# Patient Record
Sex: Male | Born: 1955
Health system: Southern US, Community
[De-identification: ages and names within clinical notes are randomized; demographics above are authoritative.]

## PROBLEM LIST (undated history)

## (undated) DIAGNOSIS — C4490 Unspecified malignant neoplasm of skin, unspecified: Secondary | ICD-10-CM

## (undated) HISTORY — PX: APPENDECTOMY: SHX54

---

## 2015-09-07 ENCOUNTER — Emergency Department (HOSPITAL_COMMUNITY)
Admission: EM | Admit: 2015-09-07 | Discharge: 2015-09-08 | Disposition: A | Discharge status: Home / Self Care | Payer: Managed Care, Other (non HMO) | Attending: Emergency Medicine | Admitting: Emergency Medicine

## 2015-09-07 ENCOUNTER — Encounter (HOSPITAL_COMMUNITY): Payer: Self-pay | Admitting: Emergency Medicine

## 2015-09-07 DIAGNOSIS — Z85828 Personal history of other malignant neoplasm of skin: Secondary | ICD-10-CM | POA: Diagnosis not present

## 2015-09-07 DIAGNOSIS — R7989 Other specified abnormal findings of blood chemistry: Secondary | ICD-10-CM | POA: Diagnosis present

## 2015-09-07 DIAGNOSIS — R899 Unspecified abnormal finding in specimens from other organs, systems and tissues: Secondary | ICD-10-CM

## 2015-09-07 HISTORY — DX: Unspecified malignant neoplasm of skin, unspecified: C44.90

## 2015-09-07 LAB — CBC
HCT: 46.7 % (ref 39.0–52.0)
HEMOGLOBIN: 15.9 g/dL (ref 13.0–17.0)
MCH: 29.9 pg (ref 26.0–34.0)
MCHC: 34 g/dL (ref 30.0–36.0)
MCV: 87.8 fL (ref 78.0–100.0)
Platelets: UNDETERMINED 10*3/uL (ref 150–400)
RBC: 5.32 MIL/uL (ref 4.22–5.81)
RDW: 13.1 % (ref 11.5–15.5)
WBC: 6.6 10*3/uL (ref 4.0–10.5)

## 2015-09-07 LAB — CBC WITH DIFFERENTIAL/PLATELET
BASOS ABS: 0.1 10*3/uL (ref 0.0–0.1)
Basophils Relative: 1 %
EOS PCT: 3 %
Eosinophils Absolute: 0.2 10*3/uL (ref 0.0–0.7)
HEMATOCRIT: 43.6 % (ref 39.0–52.0)
HEMOGLOBIN: 15 g/dL (ref 13.0–17.0)
LYMPHS PCT: 43 %
Lymphs Abs: 2.9 10*3/uL (ref 0.7–4.0)
MCH: 30.2 pg (ref 26.0–34.0)
MCHC: 34.4 g/dL (ref 30.0–36.0)
MCV: 87.9 fL (ref 78.0–100.0)
MONOS PCT: 8 %
Monocytes Absolute: 0.5 10*3/uL (ref 0.1–1.0)
NEUTROS PCT: 45 %
Neutro Abs: 3.1 10*3/uL (ref 1.7–7.7)
Platelets: UNDETERMINED 10*3/uL (ref 150–400)
RBC: 4.96 MIL/uL (ref 4.22–5.81)
RDW: 13.3 % (ref 11.5–15.5)
WBC: 6.8 10*3/uL (ref 4.0–10.5)

## 2015-09-07 LAB — BASIC METABOLIC PANEL
ANION GAP: 11 (ref 5–15)
BUN: 15 mg/dL (ref 6–20)
CHLORIDE: 104 mmol/L (ref 101–111)
CO2: 26 mmol/L (ref 22–32)
Calcium: 9.9 mg/dL (ref 8.9–10.3)
Creatinine, Ser: 1.13 mg/dL (ref 0.61–1.24)
Glucose, Bld: 94 mg/dL (ref 65–99)
POTASSIUM: 4.4 mmol/L (ref 3.5–5.1)
SODIUM: 141 mmol/L (ref 135–145)

## 2015-09-07 NOTE — ED Notes (Signed)
Called lab, no answer. Attempted to update family on results.

## 2015-09-07 NOTE — ED Notes (Signed)
Pt reports he went to pcp today for follow up blood work and was told to come to ER d/t low platelet count. Pt sts he feels normal. No complaints at this time.

## 2015-09-07 NOTE — Discharge Instructions (Signed)
Mr. Laplante and Family,  Nice meeting you! Please follow up with your primary care provider. I have attached information for the [facility] group. Return to the emergency department if you develop fevers, chest pain, shortness of breath, easy bruising/bleeding, or fatigue. Happy Thanksgiving!  S. Wendie Simmer, PA-C

## 2015-09-07 NOTE — ED Notes (Signed)
Called main lab in regards to platelet results. They report minilab planned to redraw.

## 2015-09-07 NOTE — ED Notes (Signed)
Called main lab in regards to platelet results, currently being reviewed within lab with tech that processes. Awaiting follow up.

## 2015-09-07 NOTE — ED Notes (Signed)
Called main lab, spoke with Ulyess Mort to verify that cbc sample has been received from tube station once order has been reentered.

## 2015-09-07 NOTE — ED Provider Notes (Signed)
CSN: [id number]     Arrival date & time 09/07/15  1712 History   First MD Initiated Contact with Patient 09/07/15 2007     Chief Complaint  Patient presents with  . Abnormal Lab   HPI   Jackson Brown is a 59 y.o. M PMH significant for skin cancer presenting from his PCP office for low platelet count on routine bloodwork. He is asymptomatic. He denies petechiae, epistaxis, easy bruising, hematuria, GIB, recent illnesses.    Past Medical History  Diagnosis Date  . Skin cancer    History reviewed. No pertinent past surgical history. No family history on file. Social History  Substance Use Topics  . Smoking status: Never Smoker   . Smokeless tobacco: None  . Alcohol Use: Yes     Comment: occasional    Review of Systems  Ten systems are reviewed and are negative for acute change except as noted in the HPI  Allergies  Review of patient's allergies indicates no known allergies.  Home Medications   Prior to Admission medications   Not on File   BP 139/79 mmHg  Pulse 61  Temp(Src) 97.7 F (36.5 C) (Oral)  Resp 18  Ht 6\' 3"  (1.905 m)  Wt 115.713 kg  BMI 31.89 kg/m2  SpO2 97% Physical Exam  Constitutional: He appears well-developed and well-nourished. No distress.  HENT:  Head: Normocephalic and atraumatic.  Mouth/Throat: Oropharynx is clear and moist. No oropharyngeal exudate.  Eyes: Conjunctivae are normal. Pupils are equal, round, and reactive to light. Right eye exhibits no discharge. Left eye exhibits no discharge. No scleral icterus.  Neck: No tracheal deviation present.  Cardiovascular: Normal rate, regular rhythm, normal heart sounds and intact distal pulses.  Exam reveals no gallop and no friction rub.   No murmur heard. Pulmonary/Chest: Effort normal and breath sounds normal. No respiratory distress. He has no wheezes. He has no rales. He exhibits no tenderness.  Abdominal: Soft. Bowel sounds are normal. He exhibits no distension and no mass. There is no  tenderness. There is no rebound and no guarding.  Musculoskeletal: He exhibits no edema.  Lymphadenopathy:    He has no cervical adenopathy.  Neurological: He is alert. Coordination normal.  Skin: Skin is warm and dry. No rash noted. He is not diaphoretic. No erythema.  Psychiatric: He has a normal mood and affect. His behavior is normal.  Nursing note and vitals reviewed.   ED Course  Procedures (including critical care time) Labs Review Labs Reviewed  CBC  BASIC METABOLIC PANEL  CBC WITH DIFFERENTIAL/PLATELET    MDM   Final diagnoses:  Abnormal laboratory test   Patient non-toxic appearing and VSS.  Repeat CBC shows massive platelet clumps noted on smear. Called lab to see if they could estimate a count and they advised another platelet order.   After a delay and repeat phone calls, the order was changed to another CBC with diff, which demonstrated the same results as the previous test. Called lab without answer twice.  Discussed cased with Dr. Alfonse Spruce who advised OP follow-up with heme/onc. Patient may be safely discharged home. Discussed reasons for return. Patient to follow-up with heme/onc. Patient in understanding and agreement with the plan.   Corning Lions, PA-C 09/22/15 2058  Harvel Quale, MD 09/23/15 [phone]

## 2015-09-08 NOTE — ED Notes (Signed)
Apologized to patient for delay, reviewed discharge instructions and labs provided by MD in discharge teaching. Family acknowledges.

## 2015-09-08 NOTE — ED Notes (Signed)
Walked down to main lab to discuss results and possible alternatives for testing. Advised to recollect in a lavendar and blue top due to "clotting" on both previous blood draws. Informed Samantha, PA-C, and Dr. Sherrie George. No repeat for blood draw, patient is to discharge and follow up with oncology.

## 2015-10-12 LAB — CBC AND DIFFERENTIAL
HCT: 45 (ref 41–53)
Hemoglobin: 15.4 (ref 13.5–17.5)
Platelets: 115 — AB (ref 150–399)
WBC: 5.5

## 2017-09-26 ENCOUNTER — Ambulatory Visit (HOSPITAL_BASED_OUTPATIENT_CLINIC_OR_DEPARTMENT_OTHER)
Admission: RE | Admit: 2017-09-26 | Discharge: 2017-09-26 | Disposition: A | Discharge status: Home / Self Care | Payer: 59 | Source: Ambulatory Visit | Attending: Family Medicine | Admitting: Family Medicine

## 2017-09-26 ENCOUNTER — Encounter: Payer: Self-pay | Admitting: Family Medicine

## 2017-09-26 ENCOUNTER — Ambulatory Visit: Payer: 59 | Admitting: Family Medicine

## 2017-09-26 VITALS — BP 118/76 | HR 73 | Temp 97.7°F | Ht 75.0 in | Wt 241.1 lb

## 2017-09-26 DIAGNOSIS — M5410 Radiculopathy, site unspecified: Secondary | ICD-10-CM

## 2017-09-26 DIAGNOSIS — Z0000 Encounter for general adult medical examination without abnormal findings: Secondary | ICD-10-CM | POA: Diagnosis not present

## 2017-09-26 DIAGNOSIS — Z1211 Encounter for screening for malignant neoplasm of colon: Secondary | ICD-10-CM | POA: Insufficient documentation

## 2017-09-26 DIAGNOSIS — E559 Vitamin D deficiency, unspecified: Secondary | ICD-10-CM

## 2017-09-26 DIAGNOSIS — H6123 Impacted cerumen, bilateral: Secondary | ICD-10-CM | POA: Diagnosis not present

## 2017-09-26 MED ORDER — MELOXICAM 15 MG PO TABS: ORAL_TABLET | ORAL | 1 refills | Status: DC

## 2017-09-26 NOTE — Progress Notes (Addendum)
Subjective:  Patient ID: Jackson Brown, male    DOB: 1956/03/04  Age: 61 y.o. MRN: [id number]  CC: Establish Care   HPI Tae Vonada presents for establish care and physical exam.  He also is concerned about a pain that he he has been experiencing in his left leg.  This is been going on for 2 weeks.  The pain is located in his lateral thigh and moves down towards his knee and into his anterior leg on occasion.  It did seem to start with some back pain that has since cleared.  He is active physically.  He walks 3-5 miles daily.  He works out in [employer] including doing squats with [employer].  He has not experienced an injury.  He has had no weakness numbness or tingling.  There have been no saddle paresthesias. No bowel or bladder incontinence.  He is hearing impaired and uses amplification devices.  There is been mild decrease in force of his stream and nocturia x1.  The symptoms respond to nighttime fluid restriction.  He had a normal colonoscopy in 2014.  He lives with his wife.  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  His oldest daughter just passed her bar exam and hopes to work in [place].  His son is studying international business at the [employer].  He rarely drinks beer and does not smoke or use illicit drugs.  He has been diagnosed with thrombocytopenia in the past.  He saw the hematologist and it was discovered that this was due to clumping.  Blue top tubes have been recommended for his CBCs. History Madoc has a past medical history of Skin cancer.   He has no past surgical history on file.   His family history includes Cancer in his mother; Diabetes in his father and paternal grandfather; Early death in his mother; Hearing loss in his father; Hypertension in his brother and father; Stroke in his father.He reports that  has never smoked. he has never used smokeless tobacco. He reports that he drinks alcohol. He reports that he does not use drugs.  Outpatient  Medications Prior to Visit  Medication Sig Dispense Refill  . Multiple Vitamin (MULTIVITAMIN WITH MINERALS) TABS tablet Take 1 tablet by mouth daily.    . calcium gluconate 500 MG tablet Take 1 tablet by mouth daily.     No facility-administered medications prior to visit.     ROS Review of Systems  HENT: Negative.   Eyes: Negative.   Respiratory: Negative.   Cardiovascular: Negative.   Gastrointestinal: Negative.   Endocrine: Negative for polyphagia and polyuria.  Genitourinary: Negative for difficulty urinating, frequency, hematuria and urgency.  Musculoskeletal: Positive for myalgias. Negative for back pain.  Allergic/Immunologic: Negative for immunocompromised state.  Neurological: Negative for weakness and numbness.  Hematological: Negative for adenopathy. Does not bruise/bleed easily.  Psychiatric/Behavioral: Negative for agitation and dysphoric mood.    Objective:  BP 118/76 (BP Location: Left Arm, Patient Position: Sitting, Cuff Size: Normal)   Pulse 73   Temp 97.7 F (36.5 C) (Oral)   Ht 6\' 3"  (1.905 m)   Wt 241 lb 2 oz (109.4 kg)   SpO2 97%   BMI 30.14 kg/m   Physical Exam  Constitutional: He is oriented to person, place, and time. He appears well-developed and well-nourished. No distress.  HENT:  Head: Normocephalic and atraumatic.  Right Ear: External ear normal.  Left Ear: External ear normal.  Ears:  Mouth/Throat: Oropharynx is clear  and moist. No oropharyngeal exudate.  Eyes: Conjunctivae are normal. Pupils are equal, round, and reactive to light. Right eye exhibits no discharge. Left eye exhibits no discharge. No scleral icterus.  Neck: Neck supple. No JVD present. No tracheal deviation present. No thyromegaly present.  Cardiovascular: Normal rate, regular rhythm and normal heart sounds.  Occasional extrasystoles are present.  Pulmonary/Chest: Effort normal and breath sounds normal. No stridor.  Abdominal: Soft. Bowel sounds are normal. He exhibits no  distension. There is no tenderness. There is no rebound.  Genitourinary: Rectum normal and prostate normal. Rectal exam shows no external hemorrhoid, no internal hemorrhoid, no fissure, no mass, no tenderness, anal tone normal and guaiac negative stool. Prostate is not enlarged and not tender.  Musculoskeletal:       Right hip: He exhibits decreased range of motion. He exhibits normal strength, no tenderness and no bony tenderness.       Left hip: Normal.       Lumbar back: He exhibits normal range of motion, no tenderness, no bony tenderness, no deformity and no spasm.  Lymphadenopathy:    He has no cervical adenopathy.  Neurological: He is alert and oriented to person, place, and time. He has normal strength.  Reflex Scores:      Patellar reflexes are 1+ on the right side and 1+ on the left side.      Achilles reflexes are 1+ on the right side and 1+ on the left side. Negative dural tension signs.    Skin: Skin is warm and dry. He is not diaphoretic.  Psychiatric: He has a normal mood and affect. His behavior is normal.      Assessment & Plan:   Deontez was seen today for establish care.  Diagnoses and all orders for this visit:  Physical exam  Healthcare maintenance -     CBC; Future -     Comprehensive metabolic panel; Future -     Hepatitis C antibody; Future -     HIV antibody; Future -     Lipid panel; Future -     PSA; Future -     TSH; Future -     Urinalysis, Routine w reflex microscopic; Future -     VITAMIN D 25 Hydroxy (Vit-D Deficiency, Fractures); Future  Radiculopathy of leg -     DG Lumbar Spine Complete; Future -     DG HIPS BILAT WITH PELVIS 2V; Future -     meloxicam (MOBIC) 15 MG tablet; One daily with food for 2 weeks and then as needed.  Ceruminosis, bilateral  Vitamin D deficiency -     Vitamin D, Ergocalciferol, (DRISDOL) 50000 units CAPS capsule; Take 1 capsule (50,000 Units total) by mouth every 7 (seven) days.   I have discontinued Dahmir  Rego's calcium gluconate. I am also having him start on meloxicam and Vitamin D (Ergocalciferol). Additionally, I am having him maintain his multivitamin with minerals.  Meds ordered this encounter  Medications  . meloxicam (MOBIC) 15 MG tablet    Sig: One daily with food for 2 weeks and then as needed.    Dispense:  30 tablet    Refill:  1  . Vitamin D, Ergocalciferol, (DRISDOL) 50000 units CAPS capsule    Sig: Take 1 capsule (50,000 Units total) by mouth every 7 (seven) days.    Dispense:  30 capsule    Refill:  0     Follow-up: Return in about 2 weeks (around 10/10/2017), or if symptoms worsen  or fail to improve.  Libby Maw, MD

## 2017-09-27 ENCOUNTER — Other Ambulatory Visit (INDEPENDENT_AMBULATORY_CARE_PROVIDER_SITE_OTHER): Payer: 59

## 2017-09-27 ENCOUNTER — Encounter: Payer: Self-pay | Admitting: Family Medicine

## 2017-09-27 DIAGNOSIS — Z0000 Encounter for general adult medical examination without abnormal findings: Secondary | ICD-10-CM

## 2017-09-27 DIAGNOSIS — E559 Vitamin D deficiency, unspecified: Secondary | ICD-10-CM | POA: Insufficient documentation

## 2017-09-27 LAB — URINALYSIS, ROUTINE W REFLEX MICROSCOPIC
Bilirubin Urine: NEGATIVE
HGB URINE DIPSTICK: NEGATIVE
Ketones, ur: NEGATIVE
Leukocytes, UA: NEGATIVE
NITRITE: NEGATIVE
RBC / HPF: NONE SEEN (ref 0–?)
SPECIFIC GRAVITY, URINE: 1.02 (ref 1.000–1.030)
TOTAL PROTEIN, URINE-UPE24: NEGATIVE
URINE GLUCOSE: NEGATIVE
UROBILINOGEN UA: 0.2 (ref 0.0–1.0)
pH: 7 (ref 5.0–8.0)

## 2017-09-27 LAB — COMPREHENSIVE METABOLIC PANEL
ALT: 29 U/L (ref 0–53)
AST: 21 U/L (ref 0–37)
Albumin: 4.5 g/dL (ref 3.5–5.2)
Alkaline Phosphatase: 47 U/L (ref 39–117)
BUN: 24 mg/dL — ABNORMAL HIGH (ref 6–23)
CO2: 30 meq/L (ref 19–32)
Calcium: 9.2 mg/dL (ref 8.4–10.5)
Chloride: 103 mEq/L (ref 96–112)
Creatinine, Ser: 1.07 mg/dL (ref 0.40–1.50)
GFR: 74.23 mL/min (ref >60.00)
GLUCOSE: 102 mg/dL — AB (ref 70–99)
POTASSIUM: 4.9 meq/L (ref 3.5–5.1)
Sodium: 140 mEq/L (ref 135–145)
Total Bilirubin: 0.4 mg/dL (ref 0.2–1.2)
Total Protein: 7 g/dL (ref 6.0–8.3)

## 2017-09-27 LAB — CBC
HCT: 48.6 % (ref 39.0–52.0)
HEMOGLOBIN: 16.1 g/dL (ref 13.0–17.0)
MCHC: 33.1 g/dL (ref 30.0–36.0)
MCV: 91.8 fl (ref 78.0–100.0)
PLATELETS: 179 10*3/uL (ref 150.0–400.0)
RBC: 5.3 Mil/uL (ref 4.22–5.81)
RDW: 13.2 % (ref 11.5–15.5)
WBC: 6.1 10*3/uL (ref 4.0–10.5)

## 2017-09-27 LAB — LIPID PANEL
CHOL/HDL RATIO: 4
Cholesterol: 156 mg/dL (ref 0–200)
HDL: 34.9 mg/dL — AB (ref >39.00)
NONHDL: 121.31
TRIGLYCERIDES: 247 mg/dL — AB (ref 0.0–149.0)
VLDL: 49.4 mg/dL — ABNORMAL HIGH (ref 0.0–40.0)

## 2017-09-27 LAB — PSA: PSA: 2.39 ng/mL (ref 0.10–4.00)

## 2017-09-27 LAB — VITAMIN D 25 HYDROXY (VIT D DEFICIENCY, FRACTURES): VITD: 23.46 ng/mL — AB (ref 30.00–100.00)

## 2017-09-27 LAB — TSH: TSH: 2.74 u[IU]/mL (ref 0.35–4.50)

## 2017-09-27 LAB — LDL CHOLESTEROL, DIRECT: Direct LDL: 98 mg/dL

## 2017-09-27 MED ORDER — VITAMIN D (ERGOCALCIFEROL) 1.25 MG (50000 UNIT) PO CAPS: 50000.0000 [IU] | ORAL_CAPSULE | ORAL | 0 refills | Status: DC

## 2017-09-27 NOTE — Addendum Note (Signed)
Addended by: Jon Billings on: 09/27/2017 03:47 PM   Modules accepted: Orders

## 2017-09-28 LAB — HEPATITIS C ANTIBODY
HEP C AB: NONREACTIVE
SIGNAL TO CUT-OFF: 0.02 (ref <1.00)

## 2017-09-28 LAB — HIV ANTIBODY (ROUTINE TESTING W REFLEX): HIV: NONREACTIVE

## 2017-10-02 ENCOUNTER — Ambulatory Visit (INDEPENDENT_AMBULATORY_CARE_PROVIDER_SITE_OTHER): Payer: 59 | Admitting: Family Medicine

## 2017-10-02 ENCOUNTER — Encounter: Payer: Self-pay | Admitting: Family Medicine

## 2017-10-02 VITALS — BP 126/80 | HR 60 | Temp 97.3°F | Ht 75.0 in | Wt 240.0 lb

## 2017-10-02 DIAGNOSIS — M5416 Radiculopathy, lumbar region: Secondary | ICD-10-CM

## 2017-10-02 DIAGNOSIS — E559 Vitamin D deficiency, unspecified: Secondary | ICD-10-CM

## 2017-10-02 MED ORDER — VITAMIN D (ERGOCALCIFEROL) 1.25 MG (50000 UNIT) PO CAPS: 50000.0000 [IU] | ORAL_CAPSULE | ORAL | 1 refills | Status: DC

## 2017-10-02 MED ORDER — NAPROXEN 500 MG PO TABS: 500.0000 mg | ORAL_TABLET | Freq: Two times a day (BID) | ORAL | 0 refills | Status: DC

## 2017-10-02 MED ORDER — CYCLOBENZAPRINE HCL 10 MG PO TABS: 10.0000 mg | ORAL_TABLET | Freq: Every day | ORAL | 0 refills | Status: DC

## 2017-10-02 NOTE — Progress Notes (Signed)
_  Subjective:  Patient ID: Jackson Brown, male    DOB: May 14, 1956  Age: 61 y.o. MRN: [id number]  CC: Follow-up (medication is not working)   HPI Jackson Brown presents for follow-up of his left leg pain.  He continues to have pain radiating from his left anterior thigh down to the anterior leg.  He denies back pain.  He denies saddle paresthesias, weakness or bowel or bladder incontinence.  He says that the meloxicam really has not helped.  He says that Naprosyn and Flexeril have worked for this problem in the past.  When I asked him about muscle spasms he says that actually he felt acute isolated spasms in his posterior thigh just prior to the onset of the pain.  He does not snore or have symptoms consistent with apnea.  However according to his wife he is an active sleeper with some of his legs.  She actually sleeps on the other side of the bed away from him.  Outpatient Medications Prior to Visit  Medication Sig Dispense Refill  . Multiple Vitamin (MULTIVITAMIN WITH MINERALS) TABS tablet Take 1 tablet by mouth daily.    . meloxicam (MOBIC) 15 MG tablet One daily with food for 2 weeks and then as needed. 30 tablet 1  . Vitamin D, Ergocalciferol, (DRISDOL) 50000 units CAPS capsule Take 1 capsule (50,000 Units total) by mouth every 7 (seven) days. 30 capsule 0   No facility-administered medications prior to visit.     ROS Review of Systems  Constitutional: Negative.   Cardiovascular: Negative.   Gastrointestinal: Negative.   Genitourinary: Negative for difficulty urinating and urgency.  Musculoskeletal: Positive for myalgias. Negative for back pain, gait problem, neck pain and neck stiffness.  Skin: Negative for pallor and rash.  Neurological: Negative for weakness and numbness.  Hematological: Does not bruise/bleed easily.  Psychiatric/Behavioral: Negative.     Objective:  BP 126/80 (BP Location: Left Arm, Patient Position: Sitting, Cuff Size: Normal)   Pulse 60   Temp (!)  97.3 F (36.3 C) (Oral)   Ht 6\' 3"  (1.905 m)   Wt 240 lb (108.9 kg)   SpO2 98%   BMI 30.00 kg/m   BP Readings from Last 3 Encounters:  10/02/17 126/80  09/26/17 118/76  09/08/15 117/59    Wt Readings from Last 3 Encounters:  10/02/17 240 lb (108.9 kg)  09/26/17 241 lb 2 oz (109.4 kg)  09/07/15 255 lb 1.6 oz (115.7 kg)    Physical Exam  Constitutional: He is oriented to person, place, and time. He appears well-developed and well-nourished. No distress.  HENT:  Head: Normocephalic and atraumatic.  Right Ear: External ear normal.  Left Ear: External ear normal.  Eyes: Right eye exhibits no discharge. Left eye exhibits no discharge.  Cardiovascular:  Pulses:      Dorsalis pedis pulses are 2+ on the right side, and 2+ on the left side.       Posterior tibial pulses are 2+ on the right side, and 2+ on the left side.  Pulmonary/Chest: Effort normal.  Musculoskeletal:       Legs: Neurological: He is alert and oriented to person, place, and time.  Skin: He is not diaphoretic.    Lab Results  Component Value Date   WBC 6.1 09/27/2017   HGB 16.1 09/27/2017   HCT 48.6 09/27/2017   PLT 179.0 09/27/2017   GLUCOSE 102 (H) 09/27/2017   CHOL 156 09/27/2017   TRIG 247.0 (H) 09/27/2017   HDL 34.90 (L) 09/27/2017  LDLDIRECT 98.0 09/27/2017   ALT 29 09/27/2017   AST 21 09/27/2017   NA 140 09/27/2017   K 4.9 09/27/2017   CL 103 09/27/2017   CREATININE 1.07 09/27/2017   BUN 24 (H) 09/27/2017   CO2 30 09/27/2017   TSH 2.74 09/27/2017   PSA 2.39 09/27/2017    Dg Lumbar Spine Complete  Result Date: 09/26/2017 CLINICAL DATA:  Lower left-sided pain radiating into the left leg. Uncomfortable to lie supine. EXAM: LUMBAR SPINE - COMPLETE 4+ VIEW COMPARISON:  None in PACs FINDINGS: The lumbar vertebral bodies are preserved in height. The disc space heights are well maintained there are small endplate osteophytes at L2 through L4. There is no spondylolisthesis. There is no significant  facet joint hypertrophy. The pedicles and transverse processes are intact. IMPRESSION: There is no acute or significant chronic bony abnormality of the lumbar spine. Electronically Signed   By: David  Martinique M.D.   On: 09/26/2017 14:09   Dg Hips Bilat With Pelvis 2v  Result Date: 09/26/2017 CLINICAL DATA:  Lower left-sided pain radiating into the left leg. Uncomfortable July supine. EXAM: DG HIP (WITH OR WITHOUT PELVIS) 2V left COMPARISON:  None in PACs FINDINGS: The bony pelvis is subjectively adequately mineralized. There is no lytic nor blastic lesion. There is no acute or old fracture. AP and lateral views of the left hip reveal preservation of the joint space. The articular surfaces of the femoral head and acetabulum remains smoothly rounded. The femoral neck, intertrochanteric, and subtrochanteric regions are normal. IMPRESSION: There is no acute or significant chronic bony abnormality of the left hip. Electronically Signed   By: David  Martinique M.D.   On: 09/26/2017 14:11    Assessment & Plan:    I have discontinued [name]'s meloxicam. I am also having him start on cyclobenzaprine and naproxen. Additionally, I am having him maintain his multivitamin with minerals and Vitamin D (Ergocalciferol).  Meds ordered this encounter  Medications  . Vitamin D, Ergocalciferol, (DRISDOL) 50000 units CAPS capsule    Sig: Take 1 capsule (50,000 Units total) by mouth every 7 (seven) days.    Dispense:  12 capsule    Refill:  1  . cyclobenzaprine (FLEXERIL) 10 MG tablet    Sig: Take 1 tablet (10 mg total) by mouth at bedtime.    Dispense:  60 tablet    Refill:  0  . naproxen (NAPROSYN) 500 MG tablet    Sig: Take 1 tablet (500 mg total) by mouth 2 (two) times daily with a meal.    Dispense:  60 tablet    Refill:  0    L4/L5 RADICULOPATHY  VIT D DEF.  He will try the Naprosyn and Flexeril.  I have asked him to ask his wife to report on how he sleeps at night and whether or not he tends to  kick with his legs while asleep.  At some point we may need to consider a sleep study and/or an MRI of his lumbar spine.  Follow-up: No Follow-up on file.  Libby Maw, MD

## 2017-10-11 ENCOUNTER — Ambulatory Visit: Payer: 59 | Admitting: Family Medicine

## 2017-10-11 ENCOUNTER — Ambulatory Visit (INDEPENDENT_AMBULATORY_CARE_PROVIDER_SITE_OTHER): Payer: 59

## 2017-10-11 ENCOUNTER — Encounter: Payer: Self-pay | Admitting: Family Medicine

## 2017-10-11 VITALS — BP 136/90 | HR 77 | Ht 75.0 in | Wt 239.5 lb

## 2017-10-11 DIAGNOSIS — M5410 Radiculopathy, site unspecified: Secondary | ICD-10-CM | POA: Diagnosis not present

## 2017-10-11 DIAGNOSIS — M25562 Pain in left knee: Secondary | ICD-10-CM | POA: Diagnosis not present

## 2017-10-11 MED ORDER — PREDNISONE 10 MG (48) PO TBPK: ORAL_TABLET | ORAL | 0 refills | Status: DC

## 2017-10-11 NOTE — Progress Notes (Signed)
Subjective:  Patient ID: Jackson Brown, male    DOB: November 14, 1955  Age: 61 y.o. MRN: [id number]  CC: Follow-up   HPI Jackson Brown presents for follow-up of his left lateral thigh pain that has been associated with pain in his anterior shin area.  It is not improving.  He avoids walking and standing at this point.  He did not tolerate the Flexeril due to somnolence.  No back pain at this time. Admits to some pain in the knee area. He has no history of left knee problems.  There is been no locking or giving way.  No swelling or fluid collection in the left knee.  No injury history to the left knee.  He tells me also that according to his wife he does not kick in his sleep to any extent.  Outpatient Medications Prior to Visit  Medication Sig Dispense Refill  . Multiple Vitamin (MULTIVITAMIN WITH MINERALS) TABS tablet Take 1 tablet by mouth daily.    . naproxen (NAPROSYN) 500 MG tablet Take 1 tablet (500 mg total) by mouth 2 (two) times daily with a meal. 60 tablet 0  . Vitamin D, Ergocalciferol, (DRISDOL) 50000 units CAPS capsule Take 1 capsule (50,000 Units total) by mouth every 7 (seven) days. 12 capsule 1  . cyclobenzaprine (FLEXERIL) 10 MG tablet Take 1 tablet (10 mg total) by mouth at bedtime. 60 tablet 0   No facility-administered medications prior to visit.     ROS Review of Systems  Constitutional: Negative.   Gastrointestinal: Negative for constipation and nausea.  Genitourinary: Negative for decreased urine volume, difficulty urinating and frequency.  Musculoskeletal: Positive for arthralgias and gait problem. Negative for back pain and joint swelling.  Skin: Negative.  Negative for color change and rash.  Neurological: Negative for weakness and numbness.  Hematological: Does not bruise/bleed easily.  Psychiatric/Behavioral: Negative.     Objective:  BP 136/90 (BP Location: Left Arm, Patient Position: Sitting, Cuff Size: Normal)   Pulse 77   Ht 6\' 3"  (1.905 m)   Wt 239  lb 8 oz (108.6 kg)   SpO2 98%   BMI 29.94 kg/m   BP Readings from Last 3 Encounters:  10/11/17 136/90  10/02/17 126/80  09/26/17 118/76    Wt Readings from Last 3 Encounters:  10/11/17 239 lb 8 oz (108.6 kg)  10/02/17 240 lb (108.9 kg)  09/26/17 241 lb 2 oz (109.4 kg)    Physical Exam  Constitutional: He is oriented to person, place, and time. He appears well-developed and well-nourished. No distress.  HENT:  Head: Normocephalic and atraumatic.  Right Ear: External ear normal.  Left Ear: External ear normal.  Eyes: Conjunctivae are normal. Right eye exhibits no discharge. Left eye exhibits no discharge. No scleral icterus.  Neck: No JVD present. No tracheal deviation present.  Pulmonary/Chest: Effort normal.  Musculoskeletal:       Left knee: He exhibits normal range of motion, no swelling and no effusion. No tenderness found.       Lumbar back: He exhibits normal range of motion.  Neurological: He is alert and oriented to person, place, and time.  Dural tension signs remain negative.    Skin: He is not diaphoretic.  Psychiatric: He has a normal mood and affect. His behavior is normal.    Lab Results  Component Value Date   WBC 6.1 09/27/2017   HGB 16.1 09/27/2017   HCT 48.6 09/27/2017   PLT 179.0 09/27/2017   GLUCOSE 102 (H) 09/27/2017  CHOL 156 09/27/2017   TRIG 247.0 (H) 09/27/2017   HDL 34.90 (L) 09/27/2017   LDLDIRECT 98.0 09/27/2017   ALT 29 09/27/2017   AST 21 09/27/2017   NA 140 09/27/2017   K 4.9 09/27/2017   CL 103 09/27/2017   CREATININE 1.07 09/27/2017   BUN 24 (H) 09/27/2017   CO2 30 09/27/2017   TSH 2.74 09/27/2017   PSA 2.39 09/27/2017    Dg Lumbar Spine Complete  Result Date: 09/26/2017 CLINICAL DATA:  Lower left-sided pain radiating into the left leg. Uncomfortable to lie supine. EXAM: LUMBAR SPINE - COMPLETE 4+ VIEW COMPARISON:  None in PACs FINDINGS: The lumbar vertebral bodies are preserved in height. The disc space heights are well  maintained there are small endplate osteophytes at L2 through L4. There is no spondylolisthesis. There is no significant facet joint hypertrophy. The pedicles and transverse processes are intact. IMPRESSION: There is no acute or significant chronic bony abnormality of the lumbar spine. Electronically Signed   By: David  Martinique M.D.   On: 09/26/2017 14:09   Dg Hips Bilat With Pelvis 2v  Result Date: 09/26/2017 CLINICAL DATA:  Lower left-sided pain radiating into the left leg. Uncomfortable July supine. EXAM: DG HIP (WITH OR WITHOUT PELVIS) 2V left COMPARISON:  None in PACs FINDINGS: The bony pelvis is subjectively adequately mineralized. There is no lytic nor blastic lesion. There is no acute or old fracture. AP and lateral views of the left hip reveal preservation of the joint space. The articular surfaces of the femoral head and acetabulum remains smoothly rounded. The femoral neck, intertrochanteric, and subtrochanteric regions are normal. IMPRESSION: There is no acute or significant chronic bony abnormality of the left hip. Electronically Signed   By: David  Martinique M.D.   On: 09/26/2017 14:11    Assessment & Plan:   Jackson Brown was seen today for follow-up.  Diagnoses and all orders for this visit:  Radiculopathy of leg -     predniSONE (STERAPRED UNI-PAK 48 TAB) 10 MG (48) TBPK tablet; Pharm to dose a 12 day dose pack -     Ambulatory referral to Sports Medicine  Acute pain of left knee -     DG Knee Complete 4 Views Left; Future -     DG Knee Complete 4 Views Left  L4/L5 radiculopathy?  Patient continues with symptoms that are consistent with an L4/L5 radiculopathy.  He has no back pain.  Left knee x-ray was essentially normal today mild narrowing of the medial compartment.  He has taken prednisone in the past without issue.  We discussed using gabapentin and he wants to hold off for about for now.  We will seek a second opinion from our sports medicine doctor as to whether or not an MRI  would be appropriate diagnostic study at this time.  I have discontinued Jackson Brown's cyclobenzaprine. I am also having him start on predniSONE. Additionally, I am having him maintain his multivitamin with minerals, Vitamin D (Ergocalciferol), and naproxen.  Meds ordered this encounter  Medications  . predniSONE (STERAPRED UNI-PAK 48 TAB) 10 MG (48) TBPK tablet    Sig: Pharm to dose a 12 day dose pack    Dispense:  48 tablet    Refill:  0     Follow-up: No Follow-up on file.  Libby Maw, MD

## 2017-10-12 ENCOUNTER — Encounter: Payer: Self-pay | Admitting: Family Medicine

## 2017-10-12 ENCOUNTER — Ambulatory Visit: Payer: 59 | Admitting: Family Medicine

## 2017-10-12 VITALS — BP 134/74 | HR 74 | Temp 98.1°F | Ht 75.0 in | Wt 241.0 lb

## 2017-10-12 DIAGNOSIS — M5410 Radiculopathy, site unspecified: Secondary | ICD-10-CM | POA: Diagnosis not present

## 2017-10-12 NOTE — Progress Notes (Signed)
Jackson Brown - 61 y.o. male MRN [id number]  Date of birth: 1956/04/21  SUBJECTIVE:  Including CC & ROS.  Chief Complaint  Patient presents with  . Left knee pain    Jackson Brown is a 61 y.o. male that is presenting with left leg and knee pain.  Ongoing for three weeks.  Pain is located on the lateral aspect of his left thigh and radiates down to his knee. Pain is described as an ache. Denies swelling or tenderness. He is very active daily, walking 5 miles a day. He started taking Prednisone yesterday noticed some improvement. Pain worsening while walking.  Denies injury or previous surgeries. He participated in [place] earlier this month and notices symptoms sometime after that. Symptoms are much worse with walking. He did not tolerate Flexeril. He denies any back pain. Denies any prior history of similar symptoms. Feels like his symptoms are staying the same. Pain is moderate in nature and intermittent. Denies any numbness.  Independent review of the left knee x-rays from 12/27 show mild medial joint space narrowing but otherwise normal appearance.  Independent review of the lumbar spine x-rays from 12/12 no significant degenerative changes. Independent review of the AP of the pelvis and left hip x-rays show normal appearance.    Review of Systems  Constitutional: Negative for fever.  Respiratory: Negative for shortness of breath.   Cardiovascular: Negative for chest pain.  Gastrointestinal: Negative for abdominal pain.  Musculoskeletal: Negative for back pain and gait problem.  Skin: Negative for color change.  Neurological: Negative for weakness and numbness.  Hematological: Negative for adenopathy.  Psychiatric/Behavioral: Negative for agitation.    HISTORY: Past Medical, Surgical, Social, and Family History Reviewed & Updated per EMR.   Pertinent Historical Findings include:  Past Medical History:  Diagnosis Date  . Skin cancer     No past surgical history on  file.  No Known Allergies  Family History  Problem Relation Age of Onset  . Cancer Mother   . Early death Mother   . Diabetes Father   . Hearing loss Father   . Hypertension Father   . Stroke Father   . Hypertension Brother   . Diabetes Paternal Grandfather      Social History   Socioeconomic History  . Marital status: Married    Spouse name: Not on file  . Number of children: Not on file  . Years of education: Not on file  . Highest education level: Not on file  Social Needs  . Financial resource strain: Not on file  . Food insecurity - worry: Not on file  . Food insecurity - inability: Not on file  . Transportation needs - medical: Not on file  . Transportation needs - non-medical: Not on file  Occupational History  . Not on file  Tobacco Use  . Smoking status: Never Smoker  . Smokeless tobacco: Never Used  Substance and Sexual Activity  . Alcohol use: Yes    Comment: occasional  . Drug use: No  . Sexual activity: Yes    Partners: Female  Other Topics Concern  . Not on file  Social History Narrative  . Not on file     PHYSICAL EXAM:  VS: BP 134/74 (BP Location: Left Arm, Patient Position: Sitting, Cuff Size: Normal)   Pulse 74   Temp 98.1 F (36.7 C) (Oral)   Ht 6\' 3"  (1.905 m)   Wt 241 lb (109.3 kg)   SpO2 100%   BMI 30.12 kg/m  Physical Exam Gen: NAD, alert, cooperative with exam, well-appearing ENT: normal lips, normal nasal mucosa,  Eye: normal EOM, normal conjunctiva and lids CV:  no edema, +2 pedal pulses   Resp: no accessory muscle use, non-labored, Skin: no rashes, no areas of induration  Neuro: normal tone, normal sensation to touch Psych:  normal insight, alert and oriented MSK:  Back: No tenderness to palpation over the lumbar spine, SI joints, or piriformis or greater trochanter. Normal internal and external rotation of the hips. Normal strength with hip flexion to resistance. Normal knee flexion and extension. Normal strength  resistance with knee flexion and extension. Normal gait. Normal FADIR and FABER Left-sided straight leg raise reproductive of mild symptoms. Negative straight leg raise on the right Neurovascularly intact   Left knee: No obvious effusion. Normal flexion and extension. No tenderness to palpation over the medial lateral joint line. Negative McMurray's test. Negative Nobles test  Limited ultrasound: left knee:  Quad tendon and patellar tendon are normal in appearance. No significant effusion in the suprapatellar pouch and appears to be physiologic No significant joint space narrowing of the medial or lateral joint space No significant protrusion of the medial lateral meniscus   Summary: Normal appearance  Ultrasound and interpretation by Clearance Coots, MD      ASSESSMENT & PLAN:   Radiculopathy of leg Pain is most likely radicular in nature. His knee exam and ultrasound are reassuring. Hip and lumbar exams are reassuring. Likely related to the rowing in [facility]  - counseled on HEP  - continue prednisone. Can start naproxen that he has at home after that.  - if pain continues then would consider adding gabapentin (disccussed today) and referring to PT.  - f/u in 4-6 weeks if no improvement.

## 2017-10-12 NOTE — Patient Instructions (Signed)
Please finish the prednisone and then use naproxen after that.  Please try the exercises.  Please follow up with me in 4-6 weeks if you pain doesn't seem to be improving.

## 2017-10-12 NOTE — Assessment & Plan Note (Signed)
Pain is most likely radicular in nature. His knee exam and ultrasound are reassuring. Hip and lumbar exams are reassuring. Likely related to the rowing in [facility]  - counseled on HEP  - continue prednisone. Can start naproxen that he has at home after that.  - if pain continues then would consider adding gabapentin (disccussed today) and referring to PT.  - f/u in 4-6 weeks if no improvement.

## 2017-11-05 ENCOUNTER — Other Ambulatory Visit: Payer: Self-pay | Admitting: Family Medicine

## 2017-11-19 ENCOUNTER — Ambulatory Visit: Payer: 59 | Admitting: Family Medicine

## 2017-11-19 ENCOUNTER — Encounter: Payer: Self-pay | Admitting: Family Medicine

## 2017-11-19 DIAGNOSIS — M5416 Radiculopathy, lumbar region: Secondary | ICD-10-CM | POA: Diagnosis not present

## 2017-11-19 MED ORDER — GABAPENTIN 100 MG PO CAPS: 100.0000 mg | ORAL_CAPSULE | Freq: Three times a day (TID) | ORAL | 3 refills | Status: DC

## 2017-11-19 NOTE — Patient Instructions (Signed)
Please take one pill at night in the beginning and then you can increase it to two pills or up to three pills as you tolerate.   It can cause some sleepiness but this should go away.   Please follow up with me in 4-6 weeks if your symptoms are not improving.

## 2017-11-19 NOTE — Assessment & Plan Note (Signed)
Pain is possible to be related to facet with foraminal narrowing with worse pain upon standing up straight. No neurogenic claudication. Doesn't appear to be GT bursitis. Possible for piriformis  - initiate gabapentin today  - f/u in 4-6 weeks. If no improvement consider MRI for possible epidural treatment.

## 2017-11-19 NOTE — Progress Notes (Signed)
Jackson Brown - 62 y.o. male MRN [id number]  Date of birth: 03-27-1956  SUBJECTIVE:  Including CC & ROS.  Chief Complaint  Patient presents with  . Left leg pain    Jackson Brown is a 62 y.o. male that is following up for continued left leg pain. Pain is located posterior in his gluteal aspect and radiates down his left leg. He has been taking Aleve with some improvement. He states the pain is an ache. He exercises daily, the pain is more mild after working out. Pain is intermittent. He has been completing the exercises provided from last visit. The pain has been present for about 5-6 weeks. Pain is worse with lying down and standing up straight.     Review of Systems  Constitutional: Negative for fever.  HENT: Negative for ear pain.   Respiratory: Negative for shortness of breath.   Cardiovascular: Negative for chest pain.  Gastrointestinal: Negative for abdominal pain.  Musculoskeletal: Negative for gait problem.  Skin: Negative for color change.  Neurological: Negative for weakness.  Hematological: Negative for adenopathy.  Psychiatric/Behavioral: Negative for agitation.    HISTORY: Past Medical, Surgical, Social, and Family History Reviewed & Updated per EMR.   Pertinent Historical Findings include:  Past Medical History:  Diagnosis Date  . Skin cancer     No past surgical history on file.  No Known Allergies  Family History  Problem Relation Age of Onset  . Cancer Mother   . Early death Mother   . Diabetes Father   . Hearing loss Father   . Hypertension Father   . Stroke Father   . Hypertension Brother   . Diabetes Paternal Grandfather      Social History   Socioeconomic History  . Marital status: Married    Spouse name: Not on file  . Number of children: Not on file  . Years of education: Not on file  . Highest education level: Not on file  Social Needs  . Financial resource strain: Not on file  . Food insecurity - worry: Not on file  . Food  insecurity - inability: Not on file  . Transportation needs - medical: Not on file  . Transportation needs - non-medical: Not on file  Occupational History  . Not on file  Tobacco Use  . Smoking status: Never Smoker  . Smokeless tobacco: Never Used  Substance and Sexual Activity  . Alcohol use: Yes    Comment: occasional  . Drug use: No  . Sexual activity: Yes    Partners: Female  Other Topics Concern  . Not on file  Social History Narrative  . Not on file     PHYSICAL EXAM:  VS: BP 118/76 (BP Location: Left Arm, Patient Position: Sitting, Cuff Size: Normal)   Pulse 65   Temp 98.2 F (36.8 C) (Oral)   Ht 6\' 3"  (1.905 m)   Wt 240 lb (108.9 kg)   SpO2 97%   BMI 30.00 kg/m  Physical Exam Gen: NAD, alert, cooperative with exam, well-appearing ENT: normal lips, normal nasal mucosa,  Eye: normal EOM, normal conjunctiva and lids CV:  no edema, +2 pedal pulses   Resp: no accessory muscle use, non-labored,  Skin: no rashes, no areas of induration  Neuro: normal tone, normal sensation to touch Psych:  normal insight, alert and oriented MSK:  Back:  No TTP of the GT  Normal IR and ER  Normal hip flexion strength to resistance  Normal knee flexion and extension  Negative  SLR b/l  Normal Gait  neurovascularly intact       ASSESSMENT & PLAN:   Left lumbar radiculopathy Pain is possible to be related to facet with foraminal narrowing with worse pain upon standing up straight. No neurogenic claudication. Doesn't appear to be GT bursitis. Possible for piriformis  - initiate gabapentin today  - f/u in 4-6 weeks. If no improvement consider MRI for possible epidural treatment.

## 2017-11-20 ENCOUNTER — Other Ambulatory Visit: Payer: Self-pay

## 2017-11-20 DIAGNOSIS — E559 Vitamin D deficiency, unspecified: Secondary | ICD-10-CM

## 2017-11-20 DIAGNOSIS — E7800 Pure hypercholesterolemia, unspecified: Secondary | ICD-10-CM

## 2017-11-20 DIAGNOSIS — R7989 Other specified abnormal findings of blood chemistry: Secondary | ICD-10-CM

## 2017-12-23 ENCOUNTER — Encounter: Payer: Self-pay | Admitting: Family Medicine

## 2017-12-26 ENCOUNTER — Other Ambulatory Visit (INDEPENDENT_AMBULATORY_CARE_PROVIDER_SITE_OTHER): Payer: 59

## 2017-12-26 DIAGNOSIS — E7800 Pure hypercholesterolemia, unspecified: Secondary | ICD-10-CM | POA: Diagnosis not present

## 2017-12-26 DIAGNOSIS — E559 Vitamin D deficiency, unspecified: Secondary | ICD-10-CM | POA: Diagnosis not present

## 2017-12-26 LAB — LIPID PANEL
CHOLESTEROL: 161 mg/dL (ref 0–200)
HDL: 36 mg/dL — ABNORMAL LOW (ref >39.00)
LDL Cholesterol: 95 mg/dL (ref 0–99)
NonHDL: 124.79
TRIGLYCERIDES: 150 mg/dL — AB (ref 0.0–149.0)
Total CHOL/HDL Ratio: 4
VLDL: 30 mg/dL (ref 0.0–40.0)

## 2017-12-26 LAB — VITAMIN D 25 HYDROXY (VIT D DEFICIENCY, FRACTURES): VITD: 28.99 ng/mL — AB (ref 30.00–100.00)

## 2017-12-27 ENCOUNTER — Encounter: Payer: Self-pay | Admitting: Family Medicine

## 2018-01-08 ENCOUNTER — Encounter: Payer: Self-pay | Admitting: Family Medicine

## 2018-03-15 ENCOUNTER — Telehealth: Payer: Self-pay

## 2018-03-15 NOTE — Telephone Encounter (Signed)
Okay to order a repeat lipid panel & Vit D or do you want to see patient?

## 2018-03-15 NOTE — Telephone Encounter (Signed)
I need to see patient.

## 2018-03-15 NOTE — Telephone Encounter (Signed)
Copied from [place] [id number]. Topic: Appointment Scheduling - Scheduling Inquiry for Clinic >> Mar 15, 2018  3:26 PM Synthia Innocent wrote: Reason for CRM: Patient believes he is due for lab work, does he need a follow up appt or just lab work. Please advise

## 2018-03-18 NOTE — Telephone Encounter (Signed)
I left patient a voicemail to call the office back to schedule an appointment.

## 2018-03-28 ENCOUNTER — Encounter: Payer: Self-pay | Admitting: Family Medicine

## 2018-03-28 ENCOUNTER — Ambulatory Visit: Payer: 59 | Admitting: Family Medicine

## 2018-03-28 VITALS — BP 118/70 | HR 60 | Temp 98.0°F | Ht 75.0 in | Wt 239.2 lb

## 2018-03-28 DIAGNOSIS — H6123 Impacted cerumen, bilateral: Secondary | ICD-10-CM | POA: Diagnosis not present

## 2018-03-28 DIAGNOSIS — E559 Vitamin D deficiency, unspecified: Secondary | ICD-10-CM

## 2018-03-28 LAB — VITAMIN D 25 HYDROXY (VIT D DEFICIENCY, FRACTURES): VITD: 26.39 ng/mL — AB (ref 30.00–100.00)

## 2018-03-28 MED ORDER — CLEARCANAL EAR WAX REMOVAL 6.5 % OT KIT: PACK | OTIC | 1 refills | Status: DC

## 2018-03-28 MED ORDER — VITAMIN D (ERGOCALCIFEROL) 1.25 MG (50000 UNIT) PO CAPS: 50000.0000 [IU] | ORAL_CAPSULE | ORAL | 1 refills | Status: DC

## 2018-03-28 NOTE — Patient Instructions (Signed)
Vitamin D Deficiency °Vitamin D deficiency is when your body does not have enough vitamin D. Vitamin D is important to your body for many reasons: °· It helps the body to absorb two important minerals, called calcium and phosphorus. °· It plays a role in bone health. °· It may help to prevent some diseases, such as diabetes and multiple sclerosis. °· It plays a role in muscle function, including heart function. ° °You can get vitamin D by: °· Eating foods that naturally contain vitamin D. °· Eating or drinking milk or other dairy products that have vitamin D added to them. °· Taking a vitamin D supplement or a multivitamin supplement that contains vitamin D. °· Being in the sun. Your body naturally makes vitamin D when your skin is exposed to sunlight. Your body changes the sunlight into a form of the vitamin that the body can use. ° °If vitamin D deficiency is severe, it can cause a condition in which your bones become soft. In adults, this condition is called osteomalacia. In children, this condition is called rickets. °What are the causes? °Vitamin D deficiency may be caused by: °· Not eating enough foods that contain vitamin D. °· Not getting enough sun exposure. °· Having certain digestive system diseases that make it difficult for your body to absorb vitamin D. These diseases include Crohn disease, chronic pancreatitis, and cystic fibrosis. °· Having a surgery in which a part of the stomach or a part of the small intestine is removed. °· Being obese. °· Having chronic kidney disease or liver disease. ° °What increases the risk? °This condition is more likely to develop in: °· Older people. °· People who do not spend much time outdoors. °· People who live in a long-term care facility. °· People who have had broken bones. °· People with weak or thin bones (osteoporosis). °· People who have a disease or condition that changes how the body absorbs vitamin D. °· People who have dark skin. °· People who take certain  medicines, such as steroid medicines or certain seizure medicines. °· People who are overweight or obese. ° °What are the signs or symptoms? °In mild cases of vitamin D deficiency, there may not be any symptoms. If the condition is severe, symptoms may include: °· Bone pain. °· Muscle pain. °· Falling often. °· Broken bones caused by a minor injury. ° °How is this diagnosed? °This condition is usually diagnosed with a blood test. °How is this treated? °Treatment for this condition may depend on what caused the condition. Treatment options include: °· Taking vitamin D supplements. °· Taking a calcium supplement. Your health care provider will suggest what dose is best for you. ° °Follow these instructions at home: °· Take medicines and supplements only as told by your health care provider. °· Eat foods that contain vitamin D. Choices include: °? Fortified dairy products, cereals, or juices. Fortified means that vitamin D has been added to the food. Check the label on the package to be sure. °? Fatty fish, such as salmon or trout. °? Eggs. °? Oysters. °· Do not use a tanning bed. °· Maintain a healthy weight. Lose weight, if needed. °· Keep all follow-up visits as told by your health care provider. This is important. °Contact a health care provider if: °· Your symptoms do not go away. °· You feel like throwing up (nausea) or you throw up (vomit). °· You have fewer bowel movements than usual or it is difficult for you to have a   bowel movement (constipation). This information is not intended to replace advice given to you by your health care provider. Make sure you discuss any questions you have with your health care provider. Document Released: 12/25/2011 Document Revised: 03/15/2016 Document Reviewed: 02/17/2015 Elsevier Interactive Patient Education  2018 [employer], Adult The ears produce a substance called earwax that helps keep bacteria out of the ear and protects the skin in the ear  canal. Occasionally, earwax can build up in the ear and cause discomfort or hearing loss. What increases the risk? This condition is more likely to develop in people who:  Are male.  Are elderly.  Naturally produce more earwax.  Clean their ears often with cotton swabs.  Use earplugs often.  Use in-ear headphones often.  Wear hearing aids.  Have narrow ear canals.  Have earwax that is overly thick or sticky.  Have eczema.  Are dehydrated.  Have excess hair in the ear canal.  What are the signs or symptoms? Symptoms of this condition include:  Reduced or muffled hearing.  A feeling of fullness in the ear or feeling that the ear is plugged.  Fluid coming from the ear.  Ear pain.  Ear itch.  Ringing in the ear.  Coughing.  An obvious piece of earwax that can be seen inside the ear canal.  How is this diagnosed? This condition may be diagnosed based on:  Your symptoms.  Your medical history.  An ear exam. During the exam, your health care provider will look into your ear with an instrument called an otoscope.  You may have tests, including a hearing test. How is this treated? This condition may be treated by:  Using ear drops to soften the earwax.  Having the earwax removed by a health care provider. The health care provider may: ? Flush the ear with water. ? Use an instrument that has a loop on the end (curette). ? Use a suction device.  Surgery to remove the wax buildup. This may be done in severe cases.  Follow these instructions at home:  Take over-the-counter and prescription medicines only as told by your health care provider.  Do not put any objects, including cotton swabs, into your ear. You can clean the opening of your ear canal with a washcloth or facial tissue.  Follow instructions from your health care provider about cleaning your ears. Do not over-clean your ears.  Drink enough fluid to keep your urine clear or pale yellow. This  will help to thin the earwax.  Keep all follow-up visits as told by your health care provider. If earwax builds up in your ears often or if you use hearing aids, consider seeing your health care provider for routine, preventive ear cleanings. Ask your health care provider how often you should schedule your cleanings.  If you have hearing aids, clean them according to instructions from the manufacturer and your health care provider. Contact a health care provider if:  You have ear pain.  You develop a fever.  You have blood, pus, or other fluid coming from your ear.  You have hearing loss.  You have ringing in your ears that does not go away.  Your symptoms do not improve with treatment.  You feel like the room is spinning (vertigo). Summary  Earwax can build up in the ear and cause discomfort or hearing loss.  The most common symptoms of this condition include reduced or muffled hearing and a feeling of fullness in the ear  or feeling that the ear is plugged.  This condition may be diagnosed based on your symptoms, your medical history, and an ear exam.  This condition may be treated by using ear drops to soften the earwax or by having the earwax removed by a health care provider.  Do not put any objects, including cotton swabs, into your ear. You can clean the opening of your ear canal with a washcloth or facial tissue. This information is not intended to replace advice given to you by your health care provider. Make sure you discuss any questions you have with your health care provider. Document Released: 11/09/2004 Document Revised: 12/13/2016 Document Reviewed: 12/13/2016 Elsevier Interactive Patient Education  [employer].

## 2018-03-28 NOTE — Addendum Note (Signed)
Addended by: Jon Billings on: 03/28/2018 04:49 PM   Modules accepted: Orders

## 2018-03-28 NOTE — Progress Notes (Addendum)
Subjective:  Patient ID: Jackson Brown, male    DOB: 07-12-1956  Age: 62 y.o. MRN: [id number]  CC: Follow-up   HPI Eaden Hettinger presents for follow-up of his vitamin the deficiency.  He has been taking high-dose vitamin D weekly without issue.  He continues to exercise by walking and going to the gym.  He has no other issues  at the present moment.  Outpatient Medications Prior to Visit  Medication Sig Dispense Refill  . Multiple Vitamin (MULTIVITAMIN WITH MINERALS) TABS tablet Take 1 tablet by mouth daily.    . Vitamin D, Ergocalciferol, (DRISDOL) 50000 units CAPS capsule Take 1 capsule (50,000 Units total) by mouth every 7 (seven) days. 12 capsule 1  . gabapentin (NEURONTIN) 100 MG capsule Take 1 capsule (100 mg total) by mouth 3 (three) times daily. 90 capsule 3   No facility-administered medications prior to visit.     ROS Review of Systems  Constitutional: Negative for chills, diaphoresis, fever and unexpected weight change.  HENT: Positive for hearing loss. Negative for ear discharge and ear pain.   Eyes: Positive for photophobia and visual disturbance.  Respiratory: Negative for cough and chest tightness.   Cardiovascular: Negative.   Gastrointestinal: Negative.   Skin: Negative for color change, pallor and rash.  Neurological: Negative for dizziness, weakness and numbness.  Hematological: Does not bruise/bleed easily.  Psychiatric/Behavioral: Negative.     Objective:  BP 118/70   Pulse 60   Temp 98 F (36.7 C)   Ht '6\' 3"'  (1.905 m)   Wt 239 lb 4 oz (108.5 kg)   SpO2 95%   BMI 29.90 kg/m   BP Readings from Last 3 Encounters:  03/28/18 118/70  11/19/17 118/76  10/12/17 134/74    Wt Readings from Last 3 Encounters:  03/28/18 239 lb 4 oz (108.5 kg)  11/19/17 240 lb (108.9 kg)  10/12/17 241 lb (109.3 kg)    Physical Exam  Constitutional: He appears well-developed and well-nourished. No distress.  HENT:  Head: Normocephalic.  Right Ear: External ear  normal. No foreign bodies.  Left Ear: External ear normal. A foreign body is present.  Ears:  Mouth/Throat: Oropharynx is clear and moist. No oropharyngeal exudate.  Eyes: Pupils are equal, round, and reactive to light. Conjunctivae and EOM are normal. Right eye exhibits no discharge. Left eye exhibits no discharge. No scleral icterus.  Neck: Normal range of motion. Neck supple. No JVD present. No tracheal deviation present. No thyromegaly present.  Cardiovascular: Normal rate, regular rhythm and normal heart sounds.  Pulmonary/Chest: Effort normal and breath sounds normal.  Lymphadenopathy:    He has no cervical adenopathy.  Skin: Skin is warm and dry. He is not diaphoretic.  Left upper anterior chest with scattered solar lentigines. One 75m waxy stuck on lesion.   Psychiatric: He has a normal mood and affect. His behavior is normal.    Lab Results  Component Value Date   WBC 6.1 09/27/2017   HGB 16.1 09/27/2017   HCT 48.6 09/27/2017   PLT 179.0 09/27/2017   GLUCOSE 102 (H) 09/27/2017   CHOL 161 12/26/2017   TRIG 150.0 (H) 12/26/2017   HDL 36.00 (L) 12/26/2017   LDLDIRECT 98.0 09/27/2017   LDLCALC 95 12/26/2017   ALT 29 09/27/2017   AST 21 09/27/2017   NA 140 09/27/2017   K 4.9 09/27/2017   CL 103 09/27/2017   CREATININE 1.07 09/27/2017   BUN 24 (H) 09/27/2017   CO2 30 09/27/2017   TSH 2.74 09/27/2017  PSA 2.39 09/27/2017    Dg Lumbar Spine Complete  Result Date: 09/26/2017 CLINICAL DATA:  Lower left-sided pain radiating into the left leg. Uncomfortable to lie supine. EXAM: LUMBAR SPINE - COMPLETE 4+ VIEW COMPARISON:  None in PACs FINDINGS: The lumbar vertebral bodies are preserved in height. The disc space heights are well maintained there are small endplate osteophytes at L2 through L4. There is no spondylolisthesis. There is no significant facet joint hypertrophy. The pedicles and transverse processes are intact. IMPRESSION: There is no acute or significant chronic  bony abnormality of the lumbar spine. Electronically Signed   By: David  Martinique M.D.   On: 09/26/2017 14:09   Dg Hips Bilat With Pelvis 2v  Result Date: 09/26/2017 CLINICAL DATA:  Lower left-sided pain radiating into the left leg. Uncomfortable July supine. EXAM: DG HIP (WITH OR WITHOUT PELVIS) 2V left COMPARISON:  None in PACs FINDINGS: The bony pelvis is subjectively adequately mineralized. There is no lytic nor blastic lesion. There is no acute or old fracture. AP and lateral views of the left hip reveal preservation of the joint space. The articular surfaces of the femoral head and acetabulum remains smoothly rounded. The femoral neck, intertrochanteric, and subtrochanteric regions are normal. IMPRESSION: There is no acute or significant chronic bony abnormality of the left hip. Electronically Signed   By: David  Martinique M.D.   On: 09/26/2017 14:11    Assessment & Plan:   [place] was seen today for follow-up.  Diagnoses and all orders for this visit:  Ceruminosis, bilateral -     Carbamide Peroxide-Saline (CLEARCANAL EAR WAX REMOVAL) 6.5 % KIT; Follow kit directions.  Vitamin D deficiency -     VITAMIN D 25 Hydroxy (Vit-D Deficiency, Fractures) -     Vitamin D, Ergocalciferol, (DRISDOL) 50000 units CAPS capsule; Take 1 capsule (50,000 Units total) by mouth every 7 (seven) days.   I have discontinued Bruk Hoggard's gabapentin. I am also having him start on [place]. Additionally, I am having him maintain his multivitamin with minerals and Vitamin D (Ergocalciferol).  Meds ordered this encounter  Medications  . Carbamide Peroxide-Saline (CLEARCANAL EAR WAX REMOVAL) 6.5 % KIT    Sig: Follow kit directions.    Dispense:  1 kit    Refill:  1  . Vitamin D, Ergocalciferol, (DRISDOL) 50000 units CAPS capsule    Sig: Take 1 capsule (50,000 Units total) by mouth every 7 (seven) days.    Dispense:  30 capsule    Refill:  1   Patient's left ear was irrigated.  There was  a small amount of remaining wax.  He will purchase an over-the-counter earwax removal.  And use it on a regular basis as directed by the kit.  Anticipatory guidance was given on cerumen buildup and vitamin D deficiency.  Follow-up: Return in about 6 months (around 09/27/2018).  Libby Maw, MD

## 2018-03-29 ENCOUNTER — Other Ambulatory Visit: Payer: Self-pay

## 2018-03-29 DIAGNOSIS — E559 Vitamin D deficiency, unspecified: Secondary | ICD-10-CM

## 2018-03-29 MED ORDER — VITAMIN D (ERGOCALCIFEROL) 1.25 MG (50000 UNIT) PO CAPS
ORAL_CAPSULE | ORAL | 2 refills | Status: DC
Start: 2018-03-29 — End: 2018-06-12

## 2018-05-30 ENCOUNTER — Other Ambulatory Visit (INDEPENDENT_AMBULATORY_CARE_PROVIDER_SITE_OTHER): Payer: 59

## 2018-05-30 DIAGNOSIS — E559 Vitamin D deficiency, unspecified: Secondary | ICD-10-CM

## 2018-05-30 LAB — VITAMIN D 25 HYDROXY (VIT D DEFICIENCY, FRACTURES): VITD: 44.15 ng/mL (ref 30.00–100.00)

## 2018-06-12 ENCOUNTER — Other Ambulatory Visit: Payer: Self-pay | Admitting: Family Medicine

## 2018-06-12 ENCOUNTER — Encounter: Payer: Self-pay | Admitting: Family Medicine

## 2018-06-12 DIAGNOSIS — E559 Vitamin D deficiency, unspecified: Secondary | ICD-10-CM

## 2018-08-26 ENCOUNTER — Encounter: Payer: Self-pay | Admitting: Family Medicine

## 2018-08-29 ENCOUNTER — Ambulatory Visit: Payer: 59 | Admitting: Family Medicine

## 2018-08-29 ENCOUNTER — Encounter: Payer: Self-pay | Admitting: Family Medicine

## 2018-08-29 VITALS — BP 136/80 | HR 82 | Ht 75.0 in | Wt 231.5 lb

## 2018-08-29 DIAGNOSIS — Z0001 Encounter for general adult medical examination with abnormal findings: Secondary | ICD-10-CM | POA: Diagnosis not present

## 2018-08-29 DIAGNOSIS — J069 Acute upper respiratory infection, unspecified: Secondary | ICD-10-CM

## 2018-08-29 DIAGNOSIS — E559 Vitamin D deficiency, unspecified: Secondary | ICD-10-CM | POA: Diagnosis not present

## 2018-08-29 LAB — LIPID PANEL
Cholesterol: 155 mg/dL (ref 0–200)
HDL: 34.4 mg/dL — ABNORMAL LOW (ref >39.00)
LDL Cholesterol: 91 mg/dL (ref 0–99)
NONHDL: 120.14
Total CHOL/HDL Ratio: 4
Triglycerides: 145 mg/dL (ref 0.0–149.0)
VLDL: 29 mg/dL (ref 0.0–40.0)

## 2018-08-29 LAB — COMPREHENSIVE METABOLIC PANEL
ALBUMIN: 4.4 g/dL (ref 3.5–5.2)
ALK PHOS: 49 U/L (ref 39–117)
ALT: 22 U/L (ref 0–53)
AST: 18 U/L (ref 0–37)
BUN: 15 mg/dL (ref 6–23)
CO2: 25 mEq/L (ref 19–32)
Calcium: 9.3 mg/dL (ref 8.4–10.5)
Chloride: 105 mEq/L (ref 96–112)
Creatinine, Ser: 1.08 mg/dL (ref 0.40–1.50)
GFR: 73.22 mL/min (ref >60.00)
GLUCOSE: 109 mg/dL — AB (ref 70–99)
Potassium: 4.4 mEq/L (ref 3.5–5.1)
Sodium: 139 mEq/L (ref 135–145)
Total Bilirubin: 0.5 mg/dL (ref 0.2–1.2)
Total Protein: 6.9 g/dL (ref 6.0–8.3)

## 2018-08-29 LAB — URINALYSIS, ROUTINE W REFLEX MICROSCOPIC
Bilirubin Urine: NEGATIVE
Hgb urine dipstick: NEGATIVE
Ketones, ur: NEGATIVE
Leukocytes, UA: NEGATIVE
Nitrite: NEGATIVE
PH: 7 (ref 5.0–8.0)
Specific Gravity, Urine: 1.02 (ref 1.000–1.030)
Total Protein, Urine: NEGATIVE
UROBILINOGEN UA: 0.2 (ref 0.0–1.0)
Urine Glucose: NEGATIVE

## 2018-08-29 LAB — CBC
HCT: 43.1 % (ref 39.0–52.0)
Hemoglobin: 14.5 g/dL (ref 13.0–17.0)
MCHC: 33.6 g/dL (ref 30.0–36.0)
MCV: 90.6 fl (ref 78.0–100.0)
RBC: 4.75 Mil/uL (ref 4.22–5.81)
RDW: 13 % (ref 11.5–15.5)
WBC: 6.4 10*3/uL (ref 4.0–10.5)

## 2018-08-29 LAB — VITAMIN D 25 HYDROXY (VIT D DEFICIENCY, FRACTURES): VITD: 49.73 ng/mL (ref 30.00–100.00)

## 2018-08-29 LAB — PSA: PSA: 2.85 ng/mL (ref 0.10–4.00)

## 2018-08-29 NOTE — Patient Instructions (Signed)

## 2018-08-29 NOTE — Progress Notes (Signed)
Subjective:  Patient ID: Jackson Brown, male    DOB: [birth year]  Age: 62 y.o. MRN: [id number]  CC: Annual Exam   HPI Valdez Brannan presents for a physical exam.  He continues to exercise most days of the week either going to the gym walking through his neighborhood.  He does not smoke or use illicit drugs.  He drinks alcohol on occasion.  He is an Printmaker and involves watching markets.  He stays connected with friends by going out to lunch often.  He is married and is living with his wife.  Colonoscopy back in 2014 and was advised to return in 10 years.  Urine flow has been good.  Over the last 4 to 5 days he has experienced congestion with postnasal drip cough body aches and pains.  There is been no fevers.  Denies reactive airway disease or wheezing.  Things seem to be improving for him.  He did have his flu shot this year.  He has been taking high-dose vitamin D twice a week.  Here for follow-up today.  He is fasting.  Outpatient Medications Prior to Visit  Medication Sig Dispense Refill  . Multiple Vitamin (MULTIVITAMIN WITH MINERALS) TABS tablet Take 1 tablet by mouth daily.    . Vitamin D, Ergocalciferol, (DRISDOL) 50000 units CAPS capsule TAKE 1 CAPSULE BY MOUTH 2 TIMES A WEEK 8 capsule 2  . Carbamide Peroxide-Saline (CLEARCANAL EAR WAX REMOVAL) 6.5 % KIT Follow kit directions. 1 kit 1   No facility-administered medications prior to visit.     ROS Review of Systems  Constitutional: Negative for chills, fatigue, fever and unexpected weight change.  HENT: Positive for congestion and postnasal drip. Negative for sinus pressure, sinus pain and sore throat.   Eyes: Negative for photophobia and visual disturbance.  Respiratory: Positive for cough. Negative for shortness of breath and wheezing.   Cardiovascular: Negative.   Gastrointestinal: Negative.   Endocrine: Negative for polyphagia and polyuria.  Genitourinary: Negative for difficulty urinating and urgency.    Musculoskeletal: Positive for arthralgias and myalgias. Negative for joint swelling.  Skin: Negative for pallor and rash.  Allergic/Immunologic: Negative for immunocompromised state.  Neurological: Negative for speech difficulty, light-headedness and headaches.  Hematological: Does not bruise/bleed easily.  Psychiatric/Behavioral: Negative.     Objective:  BP 136/80 (BP Location: Left Arm, Patient Position: Sitting, Cuff Size: Normal)   Pulse 82   Ht '6\' 3"'  (1.905 m)   Wt 231 lb 8 oz (105 kg)   SpO2 97%   BMI 28.94 kg/m   BP Readings from Last 3 Encounters:  08/29/18 136/80  03/28/18 118/70  11/19/17 118/76    Wt Readings from Last 3 Encounters:  08/29/18 231 lb 8 oz (105 kg)  03/28/18 239 lb 4 oz (108.5 kg)  11/19/17 240 lb (108.9 kg)    Physical Exam  Constitutional: He is oriented to person, place, and time. He appears well-developed and well-nourished. No distress.  HENT:  Head: Normocephalic and atraumatic.  Right Ear: External ear normal.  Left Ear: External ear normal.  Mouth/Throat: Oropharynx is clear and moist. No oropharyngeal exudate.  Eyes: Pupils are equal, round, and reactive to light. Conjunctivae and EOM are normal. Right eye exhibits no discharge. Left eye exhibits no discharge. No scleral icterus.  Neck: Neck supple. No JVD present. No tracheal deviation present.  Cardiovascular: Normal rate, regular rhythm and normal heart sounds.  Pulmonary/Chest: Effort normal and breath sounds normal. No stridor. No respiratory distress. He has no wheezes.  He has no rales.  Abdominal: Soft. Bowel sounds are normal. He exhibits no distension and no mass. There is no tenderness. There is no guarding.  Genitourinary: Rectum normal. Rectal exam shows no external hemorrhoid, no internal hemorrhoid, no fissure, no mass, no tenderness, anal tone normal and guaiac negative stool. Prostate is not enlarged and not tender.  Musculoskeletal: He exhibits no edema.   Lymphadenopathy:    He has no cervical adenopathy.  Neurological: He is alert and oriented to person, place, and time.  Skin: Skin is warm and dry. No rash noted. He is not diaphoretic.  Psychiatric: He has a normal mood and affect. His behavior is normal.    Lab Results  Component Value Date   WBC 6.1 09/27/2017   HGB 16.1 09/27/2017   HCT 48.6 09/27/2017   PLT 179.0 09/27/2017   GLUCOSE 102 (H) 09/27/2017   CHOL 161 12/26/2017   TRIG 150.0 (H) 12/26/2017   HDL 36.00 (L) 12/26/2017   LDLDIRECT 98.0 09/27/2017   LDLCALC 95 12/26/2017   ALT 29 09/27/2017   AST 21 09/27/2017   NA 140 09/27/2017   K 4.9 09/27/2017   CL 103 09/27/2017   CREATININE 1.07 09/27/2017   BUN 24 (H) 09/27/2017   CO2 30 09/27/2017   TSH 2.74 09/27/2017   PSA 2.39 09/27/2017    Dg Lumbar Spine Complete  Result Date: 09/26/2017 CLINICAL DATA:  Lower left-sided pain radiating into the left leg. Uncomfortable to lie supine. EXAM: LUMBAR SPINE - COMPLETE 4+ VIEW COMPARISON:  None in PACs FINDINGS: The lumbar vertebral bodies are preserved in height. The disc space heights are well maintained there are small endplate osteophytes at L2 through L4. There is no spondylolisthesis. There is no significant facet joint hypertrophy. The pedicles and transverse processes are intact. IMPRESSION: There is no acute or significant chronic bony abnormality of the lumbar spine. Electronically Signed   By: David  Martinique M.D.   On: 09/26/2017 14:09   Dg Hips Bilat With Pelvis 2v  Result Date: 09/26/2017 CLINICAL DATA:  Lower left-sided pain radiating into the left leg. Uncomfortable July supine. EXAM: DG HIP (WITH OR WITHOUT PELVIS) 2V left COMPARISON:  None in PACs FINDINGS: The bony pelvis is subjectively adequately mineralized. There is no lytic nor blastic lesion. There is no acute or old fracture. AP and lateral views of the left hip reveal preservation of the joint space. The articular surfaces of the femoral head and  acetabulum remains smoothly rounded. The femoral neck, intertrochanteric, and subtrochanteric regions are normal. IMPRESSION: There is no acute or significant chronic bony abnormality of the left hip. Electronically Signed   By: David  Martinique M.D.   On: 09/26/2017 14:11    Assessment & Plan:   [place] was seen today for annual exam.  Diagnoses and all orders for this visit:  Viral upper respiratory tract infection -     CBC  Vitamin D deficiency -     VITAMIN D 25 Hydroxy (Vit-D Deficiency, Fractures)  Encounter for health maintenance examination with abnormal findings -     Comprehensive metabolic panel -     CBC -     Lipid panel -     PSA -     Urinalysis, Routine w reflex microscopic   I have discontinued Noemi Piper's CLEARCANAL EAR WAX REMOVAL. I am also having him maintain his multivitamin with minerals and Vitamin D (Ergocalciferol).  No orders of the defined types were placed in this encounter.  Encourage patient  to stay active and engaged mentally and socially.  May be able to switch him to multivitamin with vitamin D pending today's vitamin D results.  He is to let me know if the URI is not resolved by next week.  Follow-up: Return in about 6 months (around 02/27/2019), or if symptoms worsen or fail to improve.  Libby Maw, MD

## 2018-09-03 ENCOUNTER — Encounter: Payer: Self-pay | Admitting: Family Medicine

## 2018-09-03 ENCOUNTER — Ambulatory Visit (INDEPENDENT_AMBULATORY_CARE_PROVIDER_SITE_OTHER): Payer: 59

## 2018-09-03 ENCOUNTER — Ambulatory Visit: Payer: 59 | Admitting: Family Medicine

## 2018-09-03 VITALS — BP 128/80 | HR 63 | Temp 97.6°F | Ht 75.0 in | Wt 232.5 lb

## 2018-09-03 DIAGNOSIS — J4521 Mild intermittent asthma with (acute) exacerbation: Secondary | ICD-10-CM

## 2018-09-03 MED ORDER — PREDNISONE 20 MG PO TABS: 20.0000 mg | ORAL_TABLET | Freq: Two times a day (BID) | ORAL | 0 refills | Status: AC

## 2018-09-03 MED ORDER — HYDROCOD POLST-CPM POLST ER 10-8 MG/5ML PO SUER: 5.0000 mL | Freq: Every evening | ORAL | 0 refills | Status: DC | PRN

## 2018-09-03 MED ORDER — AZITHROMYCIN 250 MG PO TABS: ORAL_TABLET | ORAL | 0 refills | Status: DC

## 2018-09-03 NOTE — Patient Instructions (Signed)
Bronchospasm, Adult Bronchospasm is a tightening of the airways going into the lungs. During an episode, it may be harder to breathe. You may cough, and you may make a whistling sound when you breathe (wheeze). This condition often affects people with asthma. What are the causes? This condition is caused by swelling and irritation in the airways. It can be triggered by:  An infection (common).  Seasonal allergies.  An allergic reaction.  Exercise.  Irritants. These include pollution, cigarette smoke, strong odors, aerosol sprays, and paint fumes.  Weather changes. Winds increase molds and pollens in the air. Cold air may cause swelling.  Stress and emotional upset.  What are the signs or symptoms? Symptoms of this condition include:  Wheezing. If the episode was triggered by an allergy, wheezing may start right away or hours later.  Nighttime coughing.  Frequent or severe coughing with a simple cold.  Chest tightness.  Shortness of breath.  Decreased ability to exercise.  How is this diagnosed? This condition is usually diagnosed with a review of your medical history and a physical exam. Tests, such as lung function tests, are sometimes done to look for other conditions. The need for a chest X-ray depends on where the wheezing occurs and whether it is the first time you have wheezed. How is this treated? This condition may be treated with:  Inhaled medicines. These open up the airways and help you breathe. They can be taken with an inhaler or a nebulizer device.  Corticosteroid medicines. These may be given for severe bronchospasm, usually when it is associated with asthma.  Avoiding triggers, such as irritants, infection, or allergies.  Follow these instructions at home: Medicines  Take over-the-counter and prescription medicines only as told by your health care provider.  If you need to use an inhaler or nebulizer to take your medicine, ask your health care  provider to explain how to use it correctly. If you were given a spacer, always use it with your inhaler. Lifestyle  Reduce the number of triggers in your home. To do this: ? Change your heating and air conditioning filter at least once a month. ? Limit your use of fireplaces and wood stoves. ? Do not smoke. Do not allow smoking in your home. ? Avoid using perfumes and fragrances. ? Get rid of pests, such as roaches and mice, and their droppings. ? Remove any mold from your home. ? Keep your house clean and dust free. Use unscented cleaning products. ? Replace carpet with wood, tile, or vinyl flooring. Carpet can trap dander and dust. ? Use allergy-proof pillows, mattress covers, and box spring covers. ? Wash bed sheets and blankets every week in hot water. Dry them in a dryer. ? Use blankets that are made of polyester or cotton. ? Wash your hands often. ? Do not allow pets in your bedroom.  Avoid breathing in cold air when you exercise. General instructions  Have a plan for seeking medical care. Know when to call your health care provider and local emergency services, and where to get emergency care.  Stay up to date on your immunizations.  When you have an episode of bronchospasm, stay calm. Try to relax and breathe more slowly.  If you have asthma, make sure you have an asthma action plan.  Keep all follow-up visits as told by your health care provider. This is important. Contact a health care provider if:  You have muscle aches.  You have chest pain.  The mucus that you   cough up (sputum) changes from clear or white to yellow, green, gray, or bloody.  You have a fever.  Your sputum gets thicker. Get help right away if:  Your wheezing and coughing get worse, even after you take your prescribed medicines.  It gets even harder to breathe.  You develop severe chest pain. Summary  Bronchospasm is a tightening of the airways going into the lungs.  During an episode of  bronchospasm, you may have a harder time breathing. You may cough and make a whistling sound when you breathe (wheeze).  Avoid exposure to triggers such as smoke, dust, mold, animal dander, and fragrances.  When you have an episode of bronchospasm, stay calm. Try to relax and breathe more slowly. This information is not intended to replace advice given to you by your health care provider. Make sure you discuss any questions you have with your health care provider. Document Released: 10/05/2003 Document Revised: 09/28/2016 Document Reviewed: 09/28/2016 [employer] Interactive Patient Education  2017 [employer]  

## 2018-09-03 NOTE — Progress Notes (Signed)
Subjective:  Patient ID: Jackson Brown, male    DOB: [birth year]  Age: 62 y.o. MRN: [id number]  CC: heavy, lingering cough (x a week to week and a half, no fever, no sinus pressure, had some pains last week but those are gone now. No other symptoms.)   HPI Jackson Brown presents for further evaluation of the URI that is persisted.  He reports an ongoing cough with some tightness in his chest that is now productive of dark mucus.  He has had no fever or frank wheezing.  He has no history of asthma.  He is never smoked.  He has tried Delsym Robitussin and NyQuil nighttime cough relief without much relief of his nighttime cough.  The cough is been keeping him up at night.  He has no known drug allergies.  Outpatient Medications Prior to Visit  Medication Sig Dispense Refill  . Multiple Vitamin (MULTIVITAMIN WITH MINERALS) TABS tablet Take 1 tablet by mouth daily.    . Vitamin D, Ergocalciferol, (DRISDOL) 50000 units CAPS capsule TAKE 1 CAPSULE BY MOUTH 2 TIMES A WEEK 8 capsule 2   No facility-administered medications prior to visit.     ROS Review of Systems  Constitutional: Negative for chills, diaphoresis, fatigue, fever and unexpected weight change.  HENT: Negative for congestion, postnasal drip, rhinorrhea and sinus pain.   Eyes: Negative for photophobia and visual disturbance.  Respiratory: Positive for chest tightness. Negative for shortness of breath and wheezing.   Cardiovascular: Negative for chest pain, palpitations and leg swelling.  Gastrointestinal: Negative.   Genitourinary: Negative.   Musculoskeletal: Negative for arthralgias and myalgias.  Skin: Negative for pallor and rash.  Allergic/Immunologic: Negative for immunocompromised state.  Neurological: Negative for weakness, light-headedness and headaches.  Hematological: Does not bruise/bleed easily.  Psychiatric/Behavioral: Negative.     Objective:  BP 128/80 (BP Location: Left Arm, Patient Position: Sitting, Cuff  Size: Normal)   Pulse 63   Temp 97.6 F (36.4 C) (Oral)   Ht 6\' 3"  (1.905 m)   Wt 232 lb 8 oz (105.5 kg)   SpO2 96%   BMI 29.06 kg/m   BP Readings from Last 3 Encounters:  09/03/18 128/80  08/29/18 136/80  03/28/18 118/70    Wt Readings from Last 3 Encounters:  09/03/18 232 lb 8 oz (105.5 kg)  08/29/18 231 lb 8 oz (105 kg)  03/28/18 239 lb 4 oz (108.5 kg)    Physical Exam  Constitutional: He is oriented to person, place, and time. He appears well-developed and well-nourished. No distress.  HENT:  Head: Normocephalic and atraumatic.  Right Ear: External ear normal.  Left Ear: External ear normal.  Mouth/Throat: Oropharynx is clear and moist. No oropharyngeal exudate.  Eyes: Pupils are equal, round, and reactive to light. Conjunctivae and EOM are normal. Right eye exhibits no discharge. Left eye exhibits no discharge. No scleral icterus.  Neck: Neck supple. No JVD present. No tracheal deviation present. No thyromegaly present.  Cardiovascular: Normal rate, regular rhythm and normal heart sounds.  Pulmonary/Chest: Effort normal. He has decreased breath sounds. He has no wheezes. He has rhonchi in the right lower field. He has no rales.  Abdominal: Bowel sounds are normal.  Neurological: He is alert and oriented to person, place, and time.  Skin: Skin is warm and dry. No rash noted. He is not diaphoretic.  Psychiatric: He has a normal mood and affect. His behavior is normal.    Lab Results  Component Value Date   WBC 6.4 08/29/2018  HGB 14.5 08/29/2018   HCT 43.1 08/29/2018   PLT 125.4 Repeated and verified X2. (L) 08/29/2018   GLUCOSE 109 (H) 08/29/2018   CHOL 155 08/29/2018   TRIG 145.0 08/29/2018   HDL 34.40 (L) 08/29/2018   LDLDIRECT 98.0 09/27/2017   LDLCALC 91 08/29/2018   ALT 22 08/29/2018   AST 18 08/29/2018   NA 139 08/29/2018   K 4.4 08/29/2018   CL 105 08/29/2018   CREATININE 1.08 08/29/2018   BUN 15 08/29/2018   CO2 25 08/29/2018   TSH 2.74  09/27/2017   PSA 2.85 08/29/2018    Dg Lumbar Spine Complete  Result Date: 09/26/2017 CLINICAL DATA:  Lower left-sided pain radiating into the left leg. Uncomfortable to lie supine. EXAM: LUMBAR SPINE - COMPLETE 4+ VIEW COMPARISON:  None in PACs FINDINGS: The lumbar vertebral bodies are preserved in height. The disc space heights are well maintained there are small endplate osteophytes at L2 through L4. There is no spondylolisthesis. There is no significant facet joint hypertrophy. The pedicles and transverse processes are intact. IMPRESSION: There is no acute or significant chronic bony abnormality of the lumbar spine. Electronically Signed   By: David  Martinique M.D.   On: 09/26/2017 14:09   Dg Hips Bilat With Pelvis 2v  Result Date: 09/26/2017 CLINICAL DATA:  Lower left-sided pain radiating into the left leg. Uncomfortable July supine. EXAM: DG HIP (WITH OR WITHOUT PELVIS) 2V left COMPARISON:  None in PACs FINDINGS: The bony pelvis is subjectively adequately mineralized. There is no lytic nor blastic lesion. There is no acute or old fracture. AP and lateral views of the left hip reveal preservation of the joint space. The articular surfaces of the femoral head and acetabulum remains smoothly rounded. The femoral neck, intertrochanteric, and subtrochanteric regions are normal. IMPRESSION: There is no acute or significant chronic bony abnormality of the left hip. Electronically Signed   By: David  Martinique M.D.   On: 09/26/2017 14:11    Assessment & Plan:   Jackson Brown was seen today for heavy, lingering cough.  Diagnoses and all orders for this visit:  Mild intermittent asthmatic bronchitis with acute exacerbation -     predniSONE (DELTASONE) 20 MG tablet; Take 1 tablet (20 mg total) by mouth 2 (two) times daily with a meal for 7 days. -     azithromycin (ZITHROMAX) 250 MG tablet; Take 2 today and then 1 each day until finished. -     chlorpheniramine-HYDROcodone (TUSSIONEX PENNKINETIC ER) 10-8  MG/5ML SUER; Take 5 mLs by mouth at bedtime as needed for cough. -     DG Chest 2 View; Future -     DG Chest 2 View   I have discontinued Jackson Brown's Vitamin D (Ergocalciferol). I am also having him start on predniSONE, azithromycin, and chlorpheniramine-HYDROcodone. Additionally, I am having him maintain his multivitamin with minerals.  Meds ordered this encounter  Medications  . predniSONE (DELTASONE) 20 MG tablet    Sig: Take 1 tablet (20 mg total) by mouth 2 (two) times daily with a meal for 7 days.    Dispense:  14 tablet    Refill:  0  . azithromycin (ZITHROMAX) 250 MG tablet    Sig: Take 2 today and then 1 each day until finished.    Dispense:  6 tablet    Refill:  0  . chlorpheniramine-HYDROcodone (TUSSIONEX PENNKINETIC ER) 10-8 MG/5ML SUER    Sig: Take 5 mLs by mouth at bedtime as needed for cough.    Dispense:  100 mL    Refill:  0     Follow-up: Return if symptoms worsen or fail to improve.  Libby Maw, MD

## 2018-09-05 ENCOUNTER — Ambulatory Visit: Payer: 59 | Admitting: Family Medicine

## 2018-09-27 ENCOUNTER — Encounter: Payer: Self-pay | Admitting: Family Medicine

## 2018-11-18 IMAGING — DX DG HIP (WITH OR WITHOUT PELVIS) 2V BILAT
3 series · 3 of 3 positions shown · non-contrast
Comparison: None in PACs

CLINICAL DATA: Lower left-sided pain radiating into the left leg.
Uncomfortable Franny supine.

EXAM:
DG HIP (WITH OR WITHOUT PELVIS) 2V left

[pelvis ap]
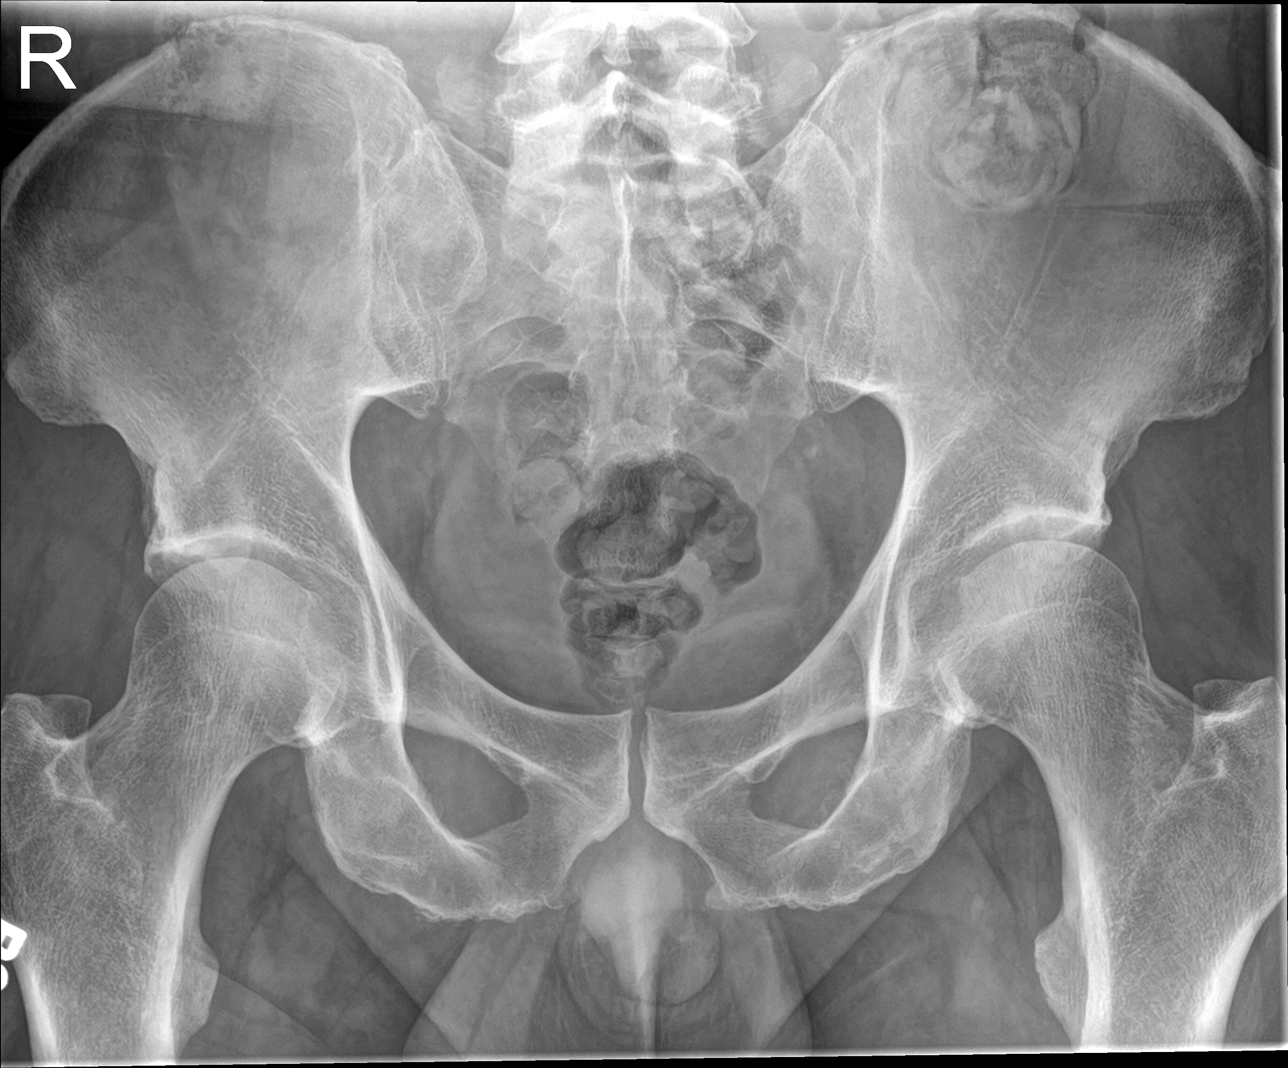

[hip ap]
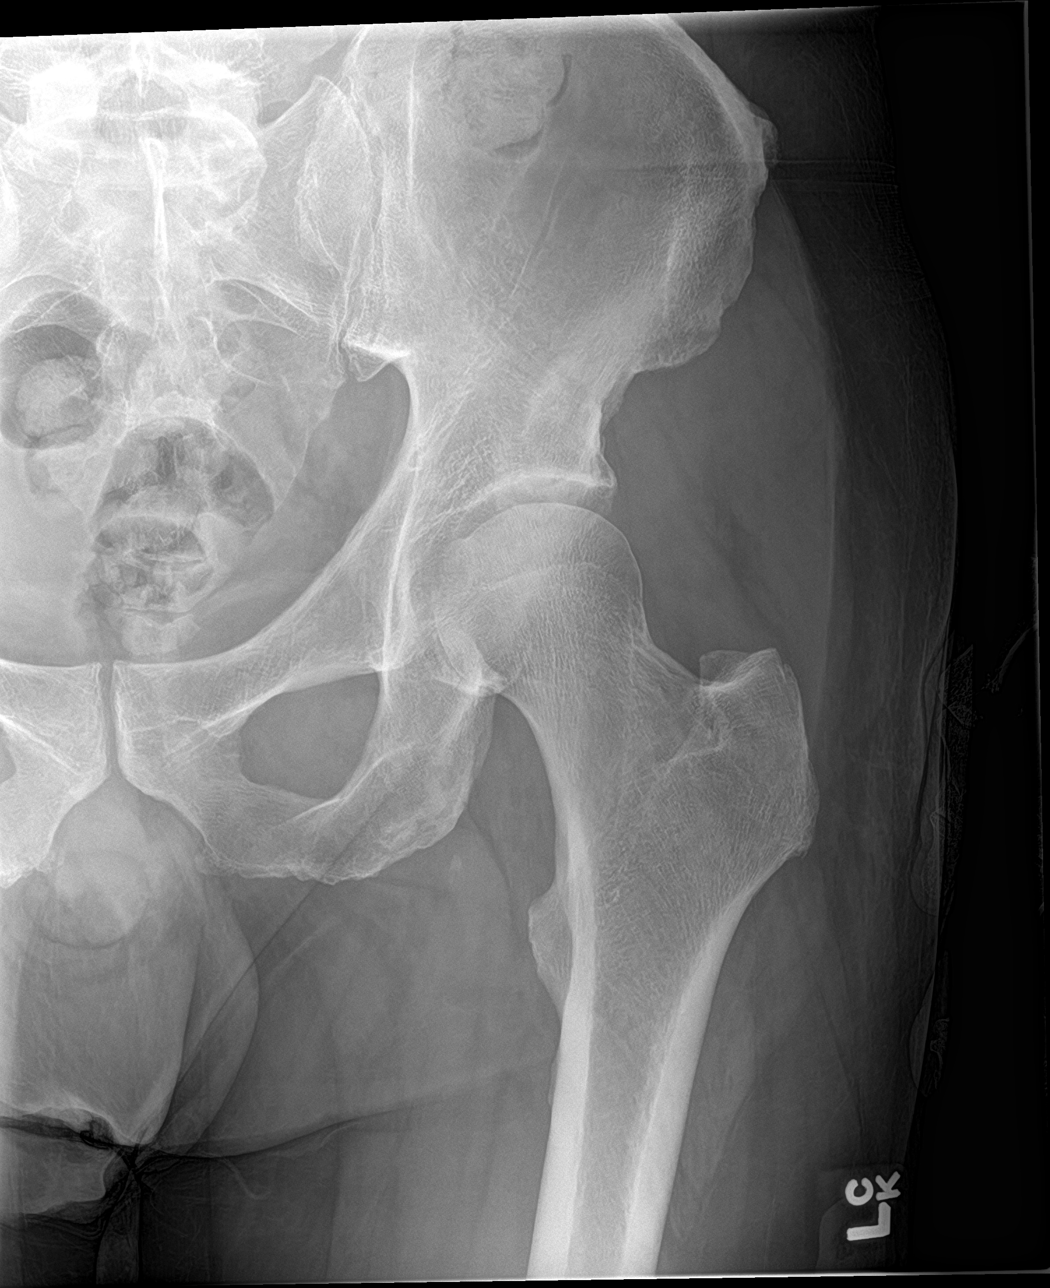

[hip lat]
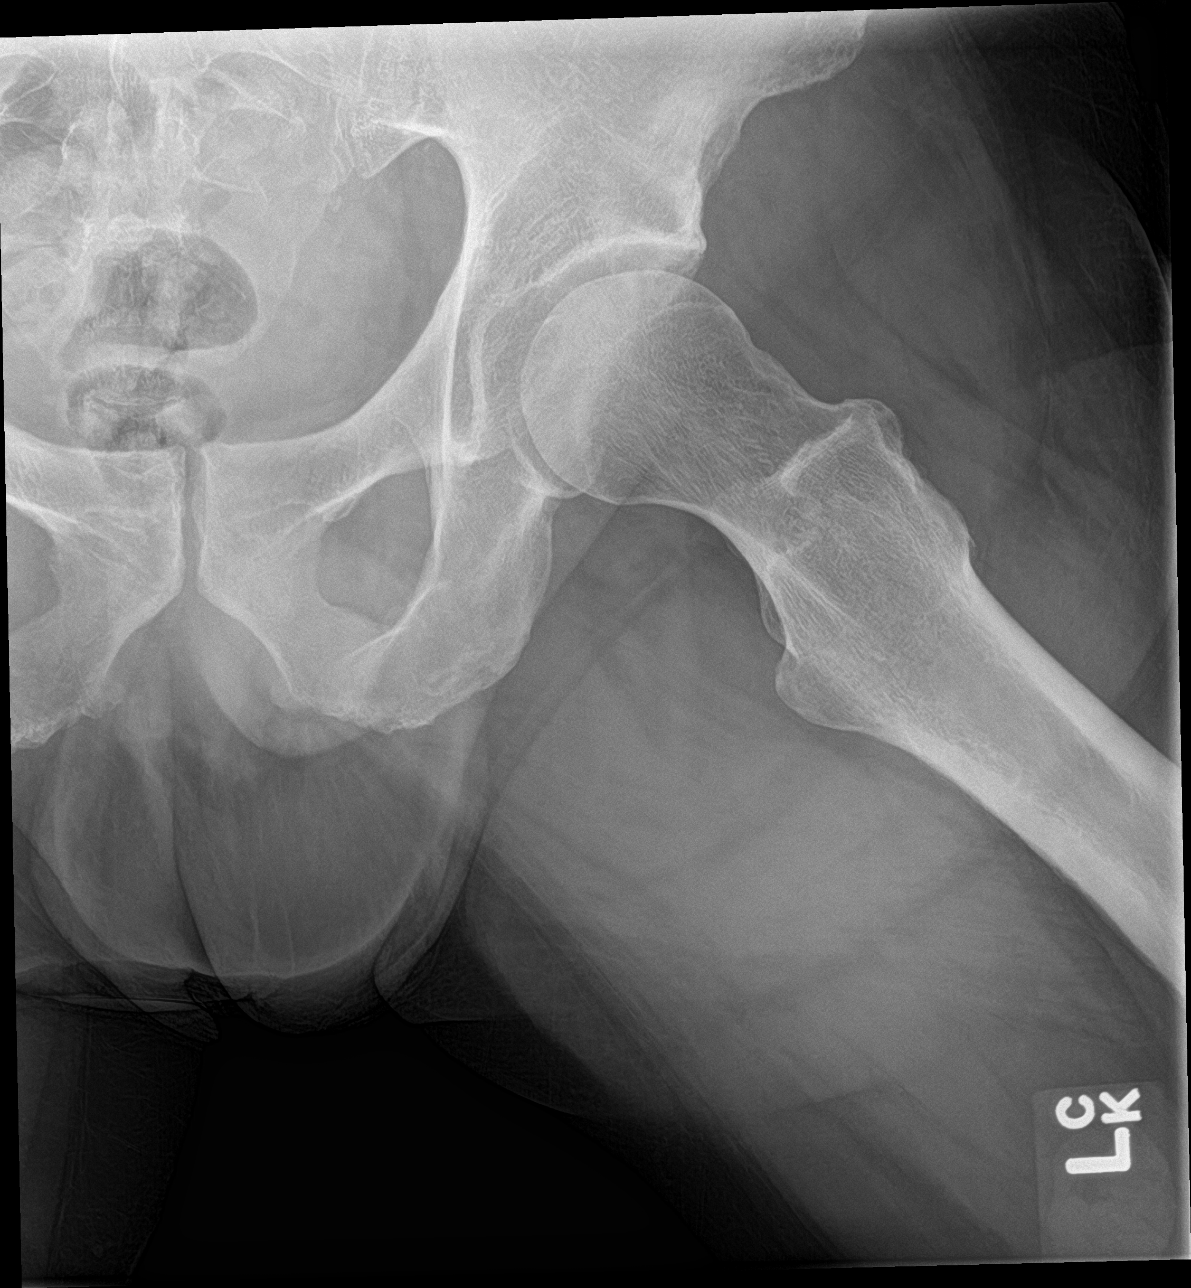

[3 of 3 positions shown; findings below may reference images not displayed]

FINDINGS: The bony pelvis is subjectively adequately mineralized. There is no
lytic nor blastic lesion. There is no acute or old fracture. AP and
lateral views of the left hip reveal preservation of the joint
space. The articular surfaces of the femoral head and acetabulum
remains smoothly rounded. The femoral neck, intertrochanteric, and
subtrochanteric regions are normal.
IMPRESSION: There is no acute or significant chronic bony abnormality of the
left hip.

## 2019-02-12 ENCOUNTER — Telehealth: Payer: Self-pay | Admitting: Family Medicine

## 2019-02-12 NOTE — Telephone Encounter (Signed)
Called and left vm for patient. Calling to schedule virtual visit with Dr. Ethelene Hal.

## 2019-02-27 ENCOUNTER — Ambulatory Visit: Payer: 59 | Admitting: Family Medicine

## 2019-08-29 ENCOUNTER — Telehealth: Payer: Self-pay

## 2019-08-29 NOTE — Telephone Encounter (Signed)

## 2019-09-01 ENCOUNTER — Other Ambulatory Visit: Payer: Self-pay

## 2019-09-01 ENCOUNTER — Ambulatory Visit (INDEPENDENT_AMBULATORY_CARE_PROVIDER_SITE_OTHER): Payer: No Typology Code available for payment source | Admitting: Family Medicine

## 2019-09-01 ENCOUNTER — Encounter: Payer: Self-pay | Admitting: Family Medicine

## 2019-09-01 VITALS — BP 126/80 | HR 54 | Ht 75.0 in | Wt 232.0 lb

## 2019-09-01 DIAGNOSIS — Z23 Encounter for immunization: Secondary | ICD-10-CM | POA: Diagnosis not present

## 2019-09-01 DIAGNOSIS — I491 Atrial premature depolarization: Secondary | ICD-10-CM | POA: Insufficient documentation

## 2019-09-01 DIAGNOSIS — R7989 Other specified abnormal findings of blood chemistry: Secondary | ICD-10-CM

## 2019-09-01 DIAGNOSIS — Z0000 Encounter for general adult medical examination without abnormal findings: Secondary | ICD-10-CM | POA: Diagnosis not present

## 2019-09-01 DIAGNOSIS — I493 Ventricular premature depolarization: Secondary | ICD-10-CM | POA: Diagnosis not present

## 2019-09-01 LAB — CBC
HCT: 45 % (ref 39.0–52.0)
Hemoglobin: 15.1 g/dL (ref 13.0–17.0)
MCHC: 33.5 g/dL (ref 30.0–36.0)
MCV: 91.9 fl (ref 78.0–100.0)
Platelets: 150 10*3/uL (ref 150.0–400.0)
RBC: 4.9 Mil/uL (ref 4.22–5.81)
RDW: 13.3 % (ref 11.5–15.5)
WBC: 7.9 10*3/uL (ref 4.0–10.5)

## 2019-09-01 LAB — COMPREHENSIVE METABOLIC PANEL
ALT: 25 U/L (ref 0–53)
AST: 22 U/L (ref 0–37)
Albumin: 4.5 g/dL (ref 3.5–5.2)
Alkaline Phosphatase: 46 U/L (ref 39–117)
BUN: 17 mg/dL (ref 6–23)
CO2: 27 mEq/L (ref 19–32)
Calcium: 9.3 mg/dL (ref 8.4–10.5)
Chloride: 104 mEq/L (ref 96–112)
Creatinine, Ser: 1.03 mg/dL (ref 0.40–1.50)
GFR: 72.53 mL/min (ref >60.00)
Glucose, Bld: 101 mg/dL — ABNORMAL HIGH (ref 70–99)
Potassium: 4.5 mEq/L (ref 3.5–5.1)
Sodium: 138 mEq/L (ref 135–145)
Total Bilirubin: 0.5 mg/dL (ref 0.2–1.2)
Total Protein: 6.7 g/dL (ref 6.0–8.3)

## 2019-09-01 LAB — URINALYSIS, ROUTINE W REFLEX MICROSCOPIC
Bilirubin Urine: NEGATIVE
Hgb urine dipstick: NEGATIVE
Ketones, ur: NEGATIVE
Leukocytes,Ua: NEGATIVE
Nitrite: NEGATIVE
Specific Gravity, Urine: 1.015 (ref 1.000–1.030)
Total Protein, Urine: NEGATIVE
Urine Glucose: NEGATIVE
Urobilinogen, UA: 0.2 (ref 0.0–1.0)
pH: 6.5 (ref 5.0–8.0)

## 2019-09-01 LAB — LDL CHOLESTEROL, DIRECT: Direct LDL: 93 mg/dL

## 2019-09-01 LAB — VITAMIN D 25 HYDROXY (VIT D DEFICIENCY, FRACTURES): VITD: 21.88 ng/mL — ABNORMAL LOW (ref 30.00–100.00)

## 2019-09-01 LAB — LIPID PANEL
Cholesterol: 148 mg/dL (ref 0–200)
HDL: 32.9 mg/dL — ABNORMAL LOW (ref >39.00)
LDL Cholesterol: 87 mg/dL (ref 0–99)
NonHDL: 114.83
Total CHOL/HDL Ratio: 4
Triglycerides: 141 mg/dL (ref 0.0–149.0)
VLDL: 28.2 mg/dL (ref 0.0–40.0)

## 2019-09-01 LAB — PSA: PSA: 2.89 ng/mL (ref 0.10–4.00)

## 2019-09-01 LAB — TSH: TSH: 2.01 u[IU]/mL (ref 0.35–4.50)

## 2019-09-01 MED ORDER — VITAMIN D (ERGOCALCIFEROL) 1.25 MG (50000 UNIT) PO CAPS: 50000.0000 [IU] | ORAL_CAPSULE | ORAL | 6 refills | Status: DC

## 2019-09-01 NOTE — Patient Instructions (Signed)
Health Maintenance, Male Adopting a healthy lifestyle and getting preventive care are important in promoting health and wellness. Ask your health care provider about:  The right schedule for you to have regular tests and exams.  Things you can do on your own to prevent diseases and keep yourself healthy. What should I know about diet, weight, and exercise? Eat a healthy diet   Eat a diet that includes plenty of vegetables, fruits, low-fat dairy products, and lean protein.  Do not eat a lot of foods that are high in solid fats, added sugars, or sodium. Maintain a healthy weight Body mass index (BMI) is a measurement that can be used to identify possible weight problems. It estimates body fat based on height and weight. Your health care provider can help determine your BMI and help you achieve or maintain a healthy weight. Get regular exercise Get regular exercise. This is one of the most important things you can do for your health. Most adults should:  Exercise for at least 150 minutes each week. The exercise should increase your heart rate and make you sweat (moderate-intensity exercise).  Do strengthening exercises at least twice a week. This is in addition to the moderate-intensity exercise.  Spend less time sitting. Even light physical activity can be beneficial. Watch cholesterol and blood lipids Have your blood tested for lipids and cholesterol at 64 years of age, then have this test every 5 years. You may need to have your cholesterol levels checked more often if:  Your lipid or cholesterol levels are high.  You are older than 64 years of age.  You are at high risk for heart disease. What should I know about cancer screening? Many types of cancers can be detected early and may often be prevented. Depending on your health history and family history, you may need to have cancer screening at various ages. This may include screening for:  Colorectal cancer.  Prostate cancer.   Skin cancer.  Lung cancer. What should I know about heart disease, diabetes, and high blood pressure? Blood pressure and heart disease  High blood pressure causes heart disease and increases the risk of stroke. This is more likely to develop in people who have high blood pressure readings, are of African descent, or are overweight.  Talk with your health care provider about your target blood pressure readings.  Have your blood pressure checked: ? Every 3-5 years if you are 18-39 years of age. ? Every year if you are 40 years old or older.  If you are between the ages of 65 and 75 and are a current or former smoker, ask your health care provider if you should have a one-time screening for abdominal aortic aneurysm (AAA). Diabetes Have regular diabetes screenings. This checks your fasting blood sugar level. Have the screening done:  Once every three years after age 45 if you are at a normal weight and have a low risk for diabetes.  More often and at a younger age if you are overweight or have a high risk for diabetes. What should I know about preventing infection? Hepatitis B If you have a higher risk for hepatitis B, you should be screened for this virus. Talk with your health care provider to find out if you are at risk for hepatitis B infection. Hepatitis C Blood testing is recommended for:  Everyone born from 1945 through 1965.  Anyone with known risk factors for hepatitis C. Sexually transmitted infections (STIs)  You should be screened each year   for STIs, including gonorrhea and chlamydia, if: ? You are sexually active and are younger than 64 years of age. ? You are older than 64 years of age and your health care provider tells you that you are at risk for this type of infection. ? Your sexual activity has changed since you were last screened, and you are at increased risk for chlamydia or gonorrhea. Ask your health care provider if you are at risk.  Ask your health care  provider about whether you are at high risk for HIV. Your health care provider may recommend a prescription medicine to help prevent HIV infection. If you choose to take medicine to prevent HIV, you should first get tested for HIV. You should then be tested every 3 months for as long as you are taking the medicine. Follow these instructions at home: Lifestyle  Do not use any products that contain nicotine or tobacco, such as cigarettes, e-cigarettes, and chewing tobacco. If you need help quitting, ask your health care provider.  Do not use street drugs.  Do not share needles.  Ask your health care provider for help if you need support or information about quitting drugs. Alcohol use  Do not drink alcohol if your health care provider tells you not to drink.  If you drink alcohol: ? Limit how much you have to 0-2 drinks a day. ? Be aware of how much alcohol is in your drink. In the [place] one drink equals one 12 oz bottle of beer (355 mL), one 5 oz glass of wine (148 mL), or one 1 oz glass of hard liquor (44 mL). General instructions  Schedule regular health, dental, and eye exams.  Stay current with your vaccines.  Tell your health care provider if: ? You often feel depressed. ? You have ever been abused or do not feel safe at home. Summary  Adopting a healthy lifestyle and getting preventive care are important in promoting health and wellness.  Follow your health care provider's instructions about healthy diet, exercising, and getting tested or screened for diseases.  Follow your health care provider's instructions on monitoring your cholesterol and blood pressure. This information is not intended to replace advice given to you by your health care provider. Make sure you discuss any questions you have with your health care provider. Document Released: 03/30/2008 Document Revised: 09/25/2018 Document Reviewed: 09/25/2018 Elsevier Patient Education  2020 [employer]  Preventive  Care 86-27 Years Old, Male Preventive care refers to lifestyle choices and visits with your health care provider that can promote health and wellness. This includes:  A yearly physical exam. This is also called an annual well check.  Regular dental and eye exams.  Immunizations.  Screening for certain conditions.  Healthy lifestyle choices, such as eating a healthy diet, getting regular exercise, not using drugs or products that contain nicotine and tobacco, and limiting alcohol use. What can I expect for my preventive care visit? Physical exam Your health care provider will check:  Height and weight. These may be used to calculate body mass index (BMI), which is a measurement that tells if you are at a healthy weight.  Heart rate and blood pressure.  Your skin for abnormal spots. Counseling Your health care provider may ask you questions about:  Alcohol, tobacco, and drug use.  Emotional well-being.  Home and relationship well-being.  Sexual activity.  Eating habits.  Work and work Statistician. What immunizations do I need?  Influenza (flu) vaccine  This is recommended every  year. Tetanus, diphtheria, and pertussis (Tdap) vaccine  You may need a Td booster every 10 years. Varicella (chickenpox) vaccine  You may need this vaccine if you have not already been vaccinated. Zoster (shingles) vaccine  You may need this after age 70. Measles, mumps, and rubella (MMR) vaccine  You may need at least one dose of MMR if you were born in 1957 or later. You may also need a second dose. Pneumococcal conjugate (PCV13) vaccine  You may need this if you have certain conditions and were not previously vaccinated. Pneumococcal polysaccharide (PPSV23) vaccine  You may need one or two doses if you smoke cigarettes or if you have certain conditions. Meningococcal conjugate (MenACWY) vaccine  You may need this if you have certain conditions. Hepatitis A vaccine  You may need  this if you have certain conditions or if you travel or work in places where you may be exposed to hepatitis A. Hepatitis B vaccine  You may need this if you have certain conditions or if you travel or work in places where you may be exposed to hepatitis B. Haemophilus influenzae type b (Hib) vaccine  You may need this if you have certain risk factors. Human papillomavirus (HPV) vaccine  If recommended by your health care provider, you may need three doses over 6 months. You may receive vaccines as individual doses or as more than one vaccine together in one shot (combination vaccines). Talk with your health care provider about the risks and benefits of combination vaccines. What tests do I need? Blood tests  Lipid and cholesterol levels. These may be checked every 5 years, or more frequently if you are over 25 years old.  Hepatitis C test.  Hepatitis B test. Screening  Lung cancer screening. You may have this screening every year starting at age 40 if you have a 30-pack-year history of smoking and currently smoke or have quit within the past 15 years.  Prostate cancer screening. Recommendations will vary depending on your family history and other risks.  Colorectal cancer screening. All adults should have this screening starting at age 77 and continuing until age 10. Your health care provider may recommend screening at age 33 if you are at increased risk. You will have tests every 1-10 years, depending on your results and the type of screening test.  Diabetes screening. This is done by checking your blood sugar (glucose) after you have not eaten for a while (fasting). You may have this done every 1-3 years.  Sexually transmitted disease (STD) testing. Follow these instructions at home: Eating and drinking  Eat a diet that includes fresh fruits and vegetables, whole grains, lean protein, and low-fat dairy products.  Take vitamin and mineral supplements as recommended by your health  care provider.  Do not drink alcohol if your health care provider tells you not to drink.  If you drink alcohol: ? Limit how much you have to 0-2 drinks a day. ? Be aware of how much alcohol is in your drink. In the [place] one drink equals one 12 oz bottle of beer (355 mL), one 5 oz glass of wine (148 mL), or one 1 oz glass of hard liquor (44 mL). Lifestyle  Take daily care of your teeth and gums.  Stay active. Exercise for at least 30 minutes on 5 or more days each week.  Do not use any products that contain nicotine or tobacco, such as cigarettes, e-cigarettes, and chewing tobacco. If you need help quitting, ask your health care  provider.  If you are sexually active, practice safe sex. Use a condom or other form of protection to prevent STIs (sexually transmitted infections).  Talk with your health care provider about taking a low-dose aspirin every day starting at age 50. What's next?  Go to your health care provider once a year for a well check visit.  Ask your health care provider how often you should have your eyes and teeth checked.  Stay up to date on all vaccines. This information is not intended to replace advice given to you by your health care provider. Make sure you discuss any questions you have with your health care provider. Document Released: 10/29/2015 Document Revised: 09/26/2018 Document Reviewed: 09/26/2018 Elsevier Patient Education  2020 [employer]  

## 2019-09-01 NOTE — Addendum Note (Signed)
Addended by: Jon Billings on: 09/01/2019 04:32 PM   Modules accepted: Orders

## 2019-09-01 NOTE — Progress Notes (Addendum)
Established Patient Office Visit  Subjective:  Patient ID: Jackson Brown, male    DOB: November 05, 1955  Age: 63 y.o. MRN: [id number]  CC:  Chief Complaint  Patient presents with   Annual Exam    HPI Ansel Ferrall presents for his yearly physical.  Continues to do well.  Continues for exercises to help his back.  Sciatica is improved.  Continues walking and stationary bike riding.  He does see the dentist.  No history of cardiac issue.  He does not smoke or use illicit drugs.  Drinks alcohol only very occasionally.  No longer taking high-dose vitamin D but is taking a multivitamin.  Denies chest pain, palpitations or shortness of breath.  He does not appreciate skipped beats.  Sciatica is improved because of the floor exercises he has been doing.  Past Medical History:  Diagnosis Date   Skin cancer     History reviewed. No pertinent surgical history.  Family History  Problem Relation Age of Onset   Cancer Mother    Early death Mother    Diabetes Father    Hearing loss Father    Hypertension Father    Stroke Father    Hypertension Brother    Diabetes Paternal Grandfather     Social History   Socioeconomic History   Marital status: Married    Spouse name: Not on file   Number of children: Not on file   Years of education: Not on file   Highest education level: Not on file  Occupational History   Not on file  Social Needs   Financial resource strain: Not on file   Food insecurity    Worry: Not on file    Inability: Not on file   Transportation needs    Medical: Not on file    Non-medical: Not on file  Tobacco Use   Smoking status: Never Smoker   Smokeless tobacco: Never Used  Substance and Sexual Activity   Alcohol use: Yes    Comment: occasional   Drug use: No   Sexual activity: Yes    Partners: Female  Lifestyle   Physical activity    Days per week: Not on file    Minutes per session: Not on file   Stress: Not on file    Relationships   Social connections    Talks on phone: Not on file    Gets together: Not on file    Attends religious service: Not on file    Active member of club or organization: Not on file    Attends meetings of clubs or organizations: Not on file    Relationship status: Not on file   Intimate partner violence    Fear of current or ex partner: Not on file    Emotionally abused: Not on file    Physically abused: Not on file    Forced sexual activity: Not on file  Other Topics Concern   Not on file  Social History Narrative   Not on file    Outpatient Medications Prior to Visit  Medication Sig Dispense Refill   Multiple Vitamin (MULTIVITAMIN WITH MINERALS) TABS tablet Take 1 tablet by mouth daily.     azithromycin (ZITHROMAX) 250 MG tablet Take 2 today and then 1 each day until finished. 6 tablet 0   chlorpheniramine-HYDROcodone (TUSSIONEX PENNKINETIC ER) 10-8 MG/5ML SUER Take 5 mLs by mouth at bedtime as needed for cough. 100 mL 0   No facility-administered medications prior to visit.  No Known Allergies  ROS Review of Systems  Constitutional: Negative for diaphoresis, fatigue, fever and unexpected weight change.  HENT: Negative.   Eyes: Negative for photophobia and visual disturbance.  Respiratory: Negative.   Cardiovascular: Negative.   Gastrointestinal: Negative.   Endocrine: Negative for polyphagia and polyuria.  Genitourinary: Negative for difficulty urinating, frequency and urgency.  Musculoskeletal: Negative for gait problem and joint swelling.  Skin: Negative for pallor and rash.  Allergic/Immunologic: Negative for immunocompromised state.  Neurological: Negative for speech difficulty.  Hematological: Does not bruise/bleed easily.  Psychiatric/Behavioral: Negative.       Objective:    Physical Exam  Constitutional: He is oriented to person, place, and time. He appears well-developed and well-nourished. No distress.  HENT:  Head:  Normocephalic and atraumatic.  Right Ear: External ear normal.  Left Ear: External ear normal.  Mouth/Throat: Oropharynx is clear and moist. No oropharyngeal exudate.  Eyes: Pupils are equal, round, and reactive to light. Conjunctivae are normal. Right eye exhibits no discharge. Left eye exhibits no discharge. No scleral icterus.  Neck: Neck supple. No JVD present. No tracheal deviation present. No thyromegaly present.  Cardiovascular: Normal rate, regular rhythm and normal heart sounds.  Occasional extrasystoles are present.  Pulmonary/Chest: Effort normal and breath sounds normal. No stridor.  Abdominal: Bowel sounds are normal.  Musculoskeletal:        General: No edema.  Lymphadenopathy:    He has no cervical adenopathy.  Neurological: He is oriented to person, place, and time.  Skin: Skin is warm and dry. He is not diaphoretic.  Psychiatric: He has a normal mood and affect. His behavior is normal.    BP 126/80    Pulse (!) 54    Ht '6\' 3"'  (1.905 m)    Wt 232 lb (105.2 kg)    SpO2 98%    BMI 29.00 kg/m  Wt Readings from Last 3 Encounters:  09/01/19 232 lb (105.2 kg)  09/03/18 232 lb 8 oz (105.5 kg)  08/29/18 231 lb 8 oz (105 kg)   BP Readings from Last 3 Encounters:  09/01/19 126/80  09/03/18 128/80  08/29/18 136/80   Guideline developer:  UpToDate (see UpToDate for funding source) Date Released: June 2014  Health Maintenance Due  Topic Date Due   TETANUS/TDAP  11/27/1973    There are no preventive care reminders to display for this patient.  Lab Results  Component Value Date   TSH 2.01 09/01/2019   Lab Results  Component Value Date   WBC 7.9 09/01/2019   HGB 15.1 09/01/2019   HCT 45.0 09/01/2019   MCV 91.9 09/01/2019   PLT 150.0 09/01/2019   Lab Results  Component Value Date   NA 138 09/01/2019   K 4.5 09/01/2019   CO2 27 09/01/2019   GLUCOSE 101 (H) 09/01/2019   BUN 17 09/01/2019   CREATININE 1.03 09/01/2019   BILITOT 0.5 09/01/2019   ALKPHOS 46  09/01/2019   AST 22 09/01/2019   ALT 25 09/01/2019   PROT 6.7 09/01/2019   ALBUMIN 4.5 09/01/2019   CALCIUM 9.3 09/01/2019   ANIONGAP 11 09/07/2015   GFR 72.53 09/01/2019   Lab Results  Component Value Date   CHOL 148 09/01/2019   Lab Results  Component Value Date   HDL 32.90 (L) 09/01/2019   Lab Results  Component Value Date   LDLCALC 87 09/01/2019   Lab Results  Component Value Date   TRIG 141.0 09/01/2019   Lab Results  Component Value Date  CHOLHDL 4 09/01/2019   No results found for: HGBA1C    Assessment & Plan:   Problem List Items Addressed This Visit      Cardiovascular and Mediastinum   Ventricular ectopy   Relevant Orders   EKG 12-Lead (Completed)   TSH (Completed)     Other   Healthcare maintenance - Primary   Relevant Orders   CBC (Completed)   Comp Met (CMET) (Completed)   Direct LDL (Completed)   Lipid Profile (Completed)   PSA (Completed)   Urinalysis, Routine w reflex microscopic (Completed)   Need for influenza vaccination   Relevant Orders   Flu Vaccine QUAD 6+ mos PF IM (Fluarix Quad PF) (Completed)   Low vitamin D level   Relevant Medications   Vitamin D, Ergocalciferol, (DRISDOL) 1.25 MG (50000 UT) CAPS capsule   Other Relevant Orders   VITAMIN D 25 Hydroxy (Vit-D Deficiency, Fractures) (Completed)      Meds ordered this encounter  Medications   Vitamin D, Ergocalciferol, (DRISDOL) 1.25 MG (50000 UT) CAPS capsule    Sig: Take 1 capsule (50,000 Units total) by mouth every 7 (seven) days.    Dispense:  5 capsule    Refill:  6    Follow-up: Return in about 6 months (around 02/29/2020).   Patient was given information on health maintenance and disease prevention.  We discussed ventricular ectopy and asked him to look out for increasing frequency.  He is not appreciating in this at this time.  He will follow-up with any palpitations shortness of breath or chest pain.  Encouraged him to continue his healthy lifestyle.  Follow-up  in 6 months.

## 2020-01-14 ENCOUNTER — Encounter: Payer: Self-pay | Admitting: Family Medicine

## 2020-01-15 NOTE — Telephone Encounter (Signed)
Okay to refer? 

## 2020-01-19 ENCOUNTER — Other Ambulatory Visit: Payer: Self-pay

## 2020-01-19 DIAGNOSIS — H9193 Unspecified hearing loss, bilateral: Secondary | ICD-10-CM

## 2020-01-19 NOTE — Progress Notes (Signed)
Referral created to Audiology per provider/thx dmf

## 2020-02-03 ENCOUNTER — Encounter: Payer: Self-pay | Admitting: Family Medicine

## 2020-02-19 ENCOUNTER — Encounter: Payer: Self-pay | Admitting: Family Medicine

## 2020-03-01 ENCOUNTER — Other Ambulatory Visit: Payer: Self-pay

## 2020-03-02 ENCOUNTER — Ambulatory Visit (INDEPENDENT_AMBULATORY_CARE_PROVIDER_SITE_OTHER): Payer: Medicare Other | Admitting: Family Medicine

## 2020-03-02 VITALS — BP 118/70 | HR 60 | Temp 98.7°F | Ht 75.0 in | Wt 216.8 lb

## 2020-03-02 DIAGNOSIS — H6123 Impacted cerumen, bilateral: Secondary | ICD-10-CM | POA: Diagnosis not present

## 2020-03-02 DIAGNOSIS — E559 Vitamin D deficiency, unspecified: Secondary | ICD-10-CM

## 2020-03-02 MED ORDER — DEBROX 6.5 % OT SOLN: OTIC | 0 refills | Status: DC

## 2020-03-02 NOTE — Patient Instructions (Signed)
Earwax Buildup, Adult The ears produce a substance called earwax that helps keep bacteria out of the ear and protects the skin in the ear canal. Occasionally, earwax can build up in the ear and cause discomfort or hearing loss. What increases the risk? This condition is more likely to develop in people who:  Are male.  Are elderly.  Naturally produce more earwax.  Clean their ears often with cotton swabs.  Use earplugs often.  Use in-ear headphones often.  Wear hearing aids.  Have narrow ear canals.  Have earwax that is overly thick or sticky.  Have eczema.  Are dehydrated.  Have excess hair in the ear canal. What are the signs or symptoms? Symptoms of this condition include:  Reduced or muffled hearing.  A feeling of fullness in the ear or feeling that the ear is plugged.  Fluid coming from the ear.  Ear pain.  Ear itch.  Ringing in the ear.  Coughing.  An obvious piece of earwax that can be seen inside the ear canal. How is this diagnosed? This condition may be diagnosed based on:  Your symptoms.  Your medical history.  An ear exam. During the exam, your health care provider will look into your ear with an instrument called an otoscope. You may have tests, including a hearing test. How is this treated? This condition may be treated by:  Using ear drops to soften the earwax.  Having the earwax removed by a health care provider. The health care provider may: ? Flush the ear with water. ? Use an instrument that has a loop on the end (curette). ? Use a suction device.  Surgery to remove the wax buildup. This may be done in severe cases. Follow these instructions at home:   Take over-the-counter and prescription medicines only as told by your health care provider.  Do not put any objects, including cotton swabs, into your ear. You can clean the opening of your ear canal with a washcloth or facial tissue.  Follow instructions from your health care  provider about cleaning your ears. Do not over-clean your ears.  Drink enough fluid to keep your urine clear or pale yellow. This will help to thin the earwax.  Keep all follow-up visits as told by your health care provider. If earwax builds up in your ears often or if you use hearing aids, consider seeing your health care provider for routine, preventive ear cleanings. Ask your health care provider how often you should schedule your cleanings.  If you have hearing aids, clean them according to instructions from the manufacturer and your health care provider. Contact a health care provider if:  You have ear pain.  You develop a fever.  You have blood, pus, or other fluid coming from your ear.  You have hearing loss.  You have ringing in your ears that does not go away.  Your symptoms do not improve with treatment.  You feel like the room is spinning (vertigo). Summary  Earwax can build up in the ear and cause discomfort or hearing loss.  The most common symptoms of this condition include reduced or muffled hearing and a feeling of fullness in the ear or feeling that the ear is plugged.  This condition may be diagnosed based on your symptoms, your medical history, and an ear exam.  This condition may be treated by using ear drops to soften the earwax or by having the earwax removed by a health care provider.  Do not put any   objects, including cotton swabs, into your ear. You can clean the opening of your ear canal with a washcloth or facial tissue. This information is not intended to replace advice given to you by your health care provider. Make sure you discuss any questions you have with your health care provider. Document Revised: 09/14/2017 Document Reviewed: 12/13/2016 Elsevier Patient Education  2020 [employer]  

## 2020-03-02 NOTE — Progress Notes (Signed)
Established Patient Office Visit  Subjective:  Patient ID: Jackson Brown, male    DOB: 15-Aug-1956  Age: 64 y.o. MRN: [id number]  CC:  Chief Complaint  Patient presents with  . Cerumen Impaction    pt here for ear irrigation both ears, no concerns.     HPI Jackson Brown presents for follow-up of his ceruminosis.  He does wear hearing aids.  Has been able to obtain his Covid vaccine.  Continues to exercise by walking. Continues weekly Vit D.   Past Medical History:  Diagnosis Date  . Skin cancer     No past surgical history on file.  Family History  Problem Relation Age of Onset  . Cancer Mother   . Early death Mother   . Diabetes Father   . Hearing loss Father   . Hypertension Father   . Stroke Father   . Hypertension Brother   . Diabetes Paternal Grandfather     Social History   Socioeconomic History  . Marital status: Married    Spouse name: Not on file  . Number of children: Not on file  . Years of education: Not on file  . Highest education level: Not on file  Occupational History  . Not on file  Tobacco Use  . Smoking status: Never Smoker  . Smokeless tobacco: Never Used  Substance and Sexual Activity  . Alcohol use: Yes    Comment: occasional  . Drug use: No  . Sexual activity: Yes    Partners: Female  Other Topics Concern  . Not on file  Social History Narrative  . Not on file   Social Determinants of Health   Financial Resource Strain:   . Difficulty of Paying Living Expenses:   Food Insecurity:   . Worried About Charity fundraiser in the Last Year:   . Arboriculturist in the Last Year:   Transportation Needs:   . Film/video editor (Medical):   Marland Kitchen Lack of Transportation (Non-Medical):   Physical Activity:   . Days of Exercise per Week:   . Minutes of Exercise per Session:   Stress:   . Feeling of Stress :   Social Connections:   . Frequency of Communication with Friends and Family:   . Frequency of Social Gatherings with  Friends and Family:   . Attends Religious Services:   . Active Member of Clubs or Organizations:   . Attends Archivist Meetings:   Marland Kitchen Marital Status:   Intimate Partner Violence:   . Fear of Current or Ex-Partner:   . Emotionally Abused:   Marland Kitchen Physically Abused:   . Sexually Abused:     Outpatient Medications Prior to Visit  Medication Sig Dispense Refill  . Multiple Vitamin (MULTIVITAMIN WITH MINERALS) TABS tablet Take 1 tablet by mouth daily.    . Vitamin D, Ergocalciferol, (DRISDOL) 1.25 MG (50000 UT) CAPS capsule Take 1 capsule (50,000 Units total) by mouth every 7 (seven) days. 5 capsule 6   No facility-administered medications prior to visit.    No Known Allergies  ROS Review of Systems  Constitutional: Negative.   HENT: Positive for hearing loss. Negative for ear discharge and ear pain.   Respiratory: Negative.   Cardiovascular: Negative.   Gastrointestinal: Negative.   Psychiatric/Behavioral: Negative.       Objective:    Physical Exam  Constitutional: He is oriented to person, place, and time. He appears well-developed and well-nourished. No distress.  HENT:  Head: Normocephalic and  atraumatic.  Right Ear: External ear normal. A foreign body is present.  Left Ear: External ear normal. A foreign body is present.  Ears:  Eyes: Conjunctivae are normal. Right eye exhibits no discharge. Left eye exhibits no discharge. No scleral icterus.  Neck: No tracheal deviation present.  Pulmonary/Chest: Effort normal. No stridor.  Neurological: He is alert and oriented to person, place, and time.  Skin: Skin is warm and dry. He is not diaphoretic.  Psychiatric: He has a normal mood and affect. His behavior is normal.   Subjective:    Jackson Brown is a 64 y.o. male whom I am asked to see for evaluation of diminished hearing in both ears for the past 6 months. There is a prior history of cerumen impaction. The patient has not been using ear drops to loosen wax  immediately prior to this visit. The patient denies ear pain.  The patient's history has been marked as reviewed and updated as appropriate.  Review of Systems Pertinent items are noted in HPI.    Objective:    Auditory canal(s) of both ears are completely obstructed with cerumen.   Cerumen was removed using gentle irrigation and soft plastic curettes. Tympanic membranes are intact following the procedure.  Auditory canals are normal.    Assessment:    Cerumen Impaction without otitis externa.    Plan:    1. Care instructions given. 2. Home treatment: ear wax oil. 3. Follow-up as needed.   BP 118/70   Pulse 60   Temp 98.7 F (37.1 C) (Tympanic)   Ht 6\' 3"  (1.905 m)   Wt 216 lb 12.8 oz (98.3 kg)   SpO2 95%   BMI 27.10 kg/m  Wt Readings from Last 3 Encounters:  03/02/20 216 lb 12.8 oz (98.3 kg)  09/01/19 232 lb (105.2 kg)  09/03/18 232 lb 8 oz (105.5 kg)     Health Maintenance Due  Topic Date Due  . COVID-19 Vaccine (1) Never done  . TETANUS/TDAP  Never done  . PNA vac Low Risk Adult (1 of 2 - PCV13) Never done    There are no preventive care reminders to display for this patient.  Lab Results  Component Value Date   TSH 2.01 09/01/2019   Lab Results  Component Value Date   WBC 7.9 09/01/2019   HGB 15.1 09/01/2019   HCT 45.0 09/01/2019   MCV 91.9 09/01/2019   PLT 150.0 09/01/2019   Lab Results  Component Value Date   NA 138 09/01/2019   K 4.5 09/01/2019   CO2 27 09/01/2019   GLUCOSE 101 (H) 09/01/2019   BUN 17 09/01/2019   CREATININE 1.03 09/01/2019   BILITOT 0.5 09/01/2019   ALKPHOS 46 09/01/2019   AST 22 09/01/2019   ALT 25 09/01/2019   PROT 6.7 09/01/2019   ALBUMIN 4.5 09/01/2019   CALCIUM 9.3 09/01/2019   ANIONGAP 11 09/07/2015   GFR 72.53 09/01/2019   Lab Results  Component Value Date   CHOL 148 09/01/2019   Lab Results  Component Value Date   HDL 32.90 (L) 09/01/2019   Lab Results  Component Value Date   LDLCALC 87 09/01/2019    Lab Results  Component Value Date   TRIG 141.0 09/01/2019   Lab Results  Component Value Date   CHOLHDL 4 09/01/2019   No results found for: HGBA1C    Assessment & Plan:   Problem List Items Addressed This Visit      Nervous and Auditory   Bilateral  impacted cerumen - Primary   Relevant Medications   carbamide peroxide (DEBROX) 6.5 % OTIC solution   carbamide peroxide (DEBROX) 6.5 % OTIC solution     Other   Vitamin D deficiency   Relevant Orders   VITAMIN D 25 Hydroxy (Vit-D Deficiency, Fractures)      Meds ordered this encounter  Medications  . carbamide peroxide (DEBROX) 6.5 % OTIC solution    Sig: 5 drops in each ear twice weekly.    Dispense:  15 mL    Refill:  0  . carbamide peroxide (DEBROX) 6.5 % OTIC solution    Sig: 5 drops in each ear 2 times weekly.    Dispense:  15 mL    Refill:  0    Follow-up: No follow-ups on file.    Libby Maw, MD

## 2020-03-03 ENCOUNTER — Encounter: Payer: Self-pay | Admitting: Family Medicine

## 2020-03-03 LAB — VITAMIN D 25 HYDROXY (VIT D DEFICIENCY, FRACTURES): VITD: 41.29 ng/mL (ref 30.00–100.00)

## 2020-03-03 MED ORDER — DEBROX 6.5 % OT SOLN: OTIC | 0 refills | Status: DC

## 2020-03-05 ENCOUNTER — Telehealth: Payer: Self-pay | Admitting: Family Medicine

## 2020-03-05 NOTE — Telephone Encounter (Signed)
Patient is calling back for lab results. CB is [phone]

## 2020-03-08 NOTE — Telephone Encounter (Signed)
Spoke with patient.

## 2020-06-28 ENCOUNTER — Encounter: Payer: Self-pay | Admitting: Family Medicine

## 2020-06-28 DIAGNOSIS — E559 Vitamin D deficiency, unspecified: Secondary | ICD-10-CM

## 2020-07-21 ENCOUNTER — Encounter: Payer: Self-pay | Admitting: Family Medicine

## 2020-08-31 ENCOUNTER — Other Ambulatory Visit: Payer: Self-pay

## 2020-09-01 ENCOUNTER — Other Ambulatory Visit: Payer: Medicare Other

## 2020-09-02 ENCOUNTER — Other Ambulatory Visit (INDEPENDENT_AMBULATORY_CARE_PROVIDER_SITE_OTHER): Payer: Medicare Other

## 2020-09-02 ENCOUNTER — Other Ambulatory Visit: Payer: Self-pay | Admitting: Family

## 2020-09-02 ENCOUNTER — Other Ambulatory Visit: Payer: Self-pay

## 2020-09-02 DIAGNOSIS — R7989 Other specified abnormal findings of blood chemistry: Secondary | ICD-10-CM

## 2020-09-02 DIAGNOSIS — E559 Vitamin D deficiency, unspecified: Secondary | ICD-10-CM

## 2020-09-02 LAB — VITAMIN D 25 HYDROXY (VIT D DEFICIENCY, FRACTURES): VITD: 27.96 ng/mL — ABNORMAL LOW (ref 30.00–100.00)

## 2020-09-02 MED ORDER — VITAMIN D (ERGOCALCIFEROL) 1.25 MG (50000 UNIT) PO CAPS: 50000.0000 [IU] | ORAL_CAPSULE | ORAL | 6 refills | Status: DC

## 2021-01-31 NOTE — Progress Notes (Signed)
Subjective:   Jackson Brown is a 65 y.o. male who presents for Medicare Annual/Subsequent preventive examination.  I connected with [name] today by telephone and verified that I am speaking with the correct person using two identifiers. Location patient: home Location provider: work Persons participating in the virtual visit: patient, Marine scientist.    I discussed the limitations, risks, security and privacy concerns of performing an evaluation and management service by telephone and the availability of in person appointments. I also discussed with the patient that there may be a patient responsible charge related to this service. The patient expressed understanding and verbally consented to this telephonic visit.    Interactive audio and video telecommunications were attempted between this provider and patient, however failed, due to patient having technical difficulties OR patient did not have access to video capability.  We continued and completed visit with audio only.  Some vital signs may be absent or patient reported.   Time Spent with patient on telephone encounter: 20 minutes   Review of Systems     Cardiac Risk Factors include: advanced age (>21men, >70 women);male gender     Objective:    Today's Vitals   02/01/21 0817  Weight: 200 lb (90.7 kg)  Height: 6\' 3"  (1.905 m)   Body mass index is 25 kg/m.  Advanced Directives 02/01/2021 09/07/2015  Does Patient Have a Medical Advance Directive? No No  Would patient like information on creating a medical advance directive? No - Patient declined -    Current Medications (verified) Outpatient Encounter Medications as of 02/01/2021  Medication Sig  . Multiple Vitamin (MULTIVITAMIN WITH MINERALS) TABS tablet Take 1 tablet by mouth daily.  . Vitamin D, Ergocalciferol, (DRISDOL) 1.25 MG (50000 UNIT) CAPS capsule Take 1 capsule (50,000 Units total) by mouth every 7 (seven) days.  . carbamide peroxide (DEBROX) 6.5 % OTIC solution 5  drops in each ear twice weekly.  . [DISCONTINUED] carbamide peroxide (DEBROX) 6.5 % OTIC solution 5 drops in each ear 2 times weekly.   No facility-administered encounter medications on file as of 02/01/2021.    Allergies (verified) Patient has no known allergies.   History: Past Medical History:  Diagnosis Date  . Skin cancer    History reviewed. No pertinent surgical history. Family History  Problem Relation Age of Onset  . Cancer Mother   . Early death Mother   . Diabetes Father   . Hearing loss Father   . Hypertension Father   . Stroke Father   . Hypertension Brother   . Diabetes Paternal Grandfather    Social History   Socioeconomic History  . Marital status: Married    Spouse name: Not on file  . Number of children: Not on file  . Years of education: Not on file  . Highest education level: Not on file  Occupational History  . Not on file  Tobacco Use  . Smoking status: Never Smoker  . Smokeless tobacco: Never Used  Substance and Sexual Activity  . Alcohol use: Yes    Comment: occasional  . Drug use: No  . Sexual activity: Yes    Partners: Female  Other Topics Concern  . Not on file  Social History Narrative  . Not on file   Social Determinants of Health   Financial Resource Strain: Low Risk   . Difficulty of Paying Living Expenses: Not hard at all  Food Insecurity: No Food Insecurity  . Worried About Charity fundraiser in the Last Year: Never true  .  Ran Out of Food in the Last Year: Never true  Transportation Needs: No Transportation Needs  . Lack of Transportation (Medical): No  . Lack of Transportation (Non-Medical): No  Physical Activity: Sufficiently Active  . Days of Exercise per Week: 4 days  . Minutes of Exercise per Session: 50 min  Stress: No Stress Concern Present  . Feeling of Stress : Not at all  Social Connections: Moderately Isolated  . Frequency of Communication with Friends and Family: More than three times a week  . Frequency  of Social Gatherings with Friends and Family: More than three times a week  . Attends Religious Services: Never  . Active Member of Clubs or Organizations: No  . Attends Archivist Meetings: Never  . Marital Status: Married    Tobacco Counseling Counseling given: Not Answered   Clinical Intake:  Pre-visit preparation completed: Yes  Pain : No/denies pain     Nutritional Status: BMI 25 -29 Overweight Nutritional Risks: None Diabetes: No  How often do you need to have someone help you when you read instructions, pamphlets, or other written materials from your doctor or pharmacy?: 1 - Never  Diabetic?No  Interpreter Needed?: No  Information entered by :: Caroleen Hamman LPN   Activities of Daily Living In your present state of health, do you have any difficulty performing the following activities: 02/01/2021  Hearing? N  Vision? N  Difficulty concentrating or making decisions? N  Walking or climbing stairs? N  Dressing or bathing? N  Doing errands, shopping? N  Preparing Food and eating ? N  Using the Toilet? N  In the past six months, have you accidently leaked urine? N  Do you have problems with loss of bowel control? N  Managing your Medications? N  Managing your Finances? N  Housekeeping or managing your Housekeeping? N  Some recent data might be hidden    Patient Care Team: Libby Maw, MD as PCP - General (Family Medicine)  Indicate any recent Medical Services you may have received from other than [facility] providers in the past year (date may be approximate).     Assessment:   This is a routine wellness examination for [name].  Hearing/Vision screen  Hearing Screening   125Hz  250Hz  500Hz  1000Hz  2000Hz  3000Hz  4000Hz  6000Hz  8000Hz   Right ear:           Left ear:           Comments: Bilateral hearing aids  Vision Screening Comments: Wears contacts Last eye exam-2021-[facility]  Dietary issues and exercise activities  discussed: Current Exercise Habits: Home exercise routine, Type of exercise: walking;Other - see comments;stretching;strength training/weights (stationary bike), Time (Minutes): 50, Frequency (Times/Week): 4, Weekly Exercise (Minutes/Week): 200, Intensity: Mild, Exercise limited by: None identified  Goals    . Patient Stated     Maintain current healthy active lifestyle      Depression Screen PHQ 2/9 Scores 02/01/2021 09/26/2017  PHQ - 2 Score 0 0    Fall Risk Fall Risk  02/01/2021  Falls in the past year? 0  Number falls in past yr: 0  Injury with Fall? 0  Follow up Falls prevention discussed    FALL RISK PREVENTION PERTAINING TO THE HOME:  Any stairs in or around the home? Yes  If so, are there any without handrails? No  Home free of loose throw rugs in walkways, pet beds, electrical cords, etc? Yes  Adequate lighting in your home to reduce risk of falls? Yes  ASSISTIVE DEVICES UTILIZED TO PREVENT FALLS:  Life alert? No  Use of a cane, walker or w/c? No  Grab bars in the bathroom? No  Shower chair or bench in shower? No  Elevated toilet seat or a handicapped toilet? No   TIMED UP AND GO:  Was the test performed? No . Phone visit   Cognitive Function:Normal cognitive status assessed by  this Nurse Health Advisor. No abnormalities found.          Immunizations Immunization History  Administered Date(s) Administered  . Influenza Inj Mdck Quad Pf 08/01/2018  . Influenza,inj,Quad PF,6+ Mos 09/01/2019  . Influenza-Unspecified 08/15/2017, 07/21/2020  . Moderna Sars-Covid-2 Vaccination 08/20/2020    TDAP status: Due, Education has been provided regarding the importance of this vaccine. Advised may receive this vaccine at local pharmacy or Health Dept. Aware to provide a copy of the vaccination record if obtained from local pharmacy or Health Dept. Verbalized acceptance and understanding.  Flu Vaccine status: Up to date  Pneumococcal vaccine status: Due, Education  has been provided regarding the importance of this vaccine. Advised may receive this vaccine at local pharmacy or Health Dept. Aware to provide a copy of the vaccination record if obtained from local pharmacy or Health Dept. Verbalized acceptance and understanding.  Covid-19 vaccine status: Information provided on how to obtain vaccines. Booster due  Qualifies for Shingles Vaccine? Yes   Zostavax completed No   Shingrix Completed?: No.    Education has been provided regarding the importance of this vaccine. Patient has been advised to call insurance company to determine out of pocket expense if they have not yet received this vaccine. Advised may also receive vaccine at local pharmacy or Health Dept. Verbalized acceptance and understanding.  Screening Tests Health Maintenance  Topic Date Due  . TETANUS/TDAP  Never done  . PNA vac Low Risk Adult (1 of 2 - PCV13) Never done  . COVID-19 Vaccine (2 - Moderna 3-dose series) 09/17/2020  . INFLUENZA VACCINE  05/16/2021  . COLONOSCOPY (Pts 45-27yrs Insurance coverage will need to be confirmed)  06/17/2023  . Hepatitis C Screening  Completed  . HPV VACCINES  Aged Out    Health Maintenance  Health Maintenance Due  Topic Date Due  . TETANUS/TDAP  Never done  . PNA vac Low Risk Adult (1 of 2 - PCV13) Never done  . COVID-19 Vaccine (2 - Moderna 3-dose series) 09/17/2020    Colorectal cancer screening: Type of screening: Colonoscopy. Completed 06/16/2013. Repeat every 10 years  Lung Cancer Screening: (Low Dose CT Chest recommended if Age 20-80 years, 30 pack-year currently smoking OR have quit w/in 15years.) does not qualify.    Additional Screening:  Hepatitis C Screening: Completed 09/27/2017  Vision Screening: Recommended annual ophthalmology exams for early detection of glaucoma and other disorders of the eye. Is the patient up to date with their annual eye exam?  Yes  Who is the provider or what is the name of the office in which the  patient attends annual eye exams? [facility]   Dental Screening: Recommended annual dental exams for proper oral hygiene  Community Resource Referral / Chronic Care Management: CRR required this visit?  No   CCM required this visit?  No      Plan:     I have personally reviewed and noted the following in the patient's chart:   . Medical and social history . Use of alcohol, tobacco or illicit drugs  . Current medications and supplements . Functional  ability and status . Nutritional status . Physical activity . Advanced directives . List of other physicians . Hospitalizations, surgeries, and ER visits in previous 12 months . Vitals . Screenings to include cognitive, depression, and falls . Referrals and appointments  In addition, I have reviewed and discussed with patient certain preventive protocols, quality metrics, and best practice recommendations. A written personalized care plan for preventive services as well as general preventive health recommendations were provided to patient.   Due to this being a telephonic visit, the after visit summary with patients personalized plan was offered to patient via mail or my-chart. Patient would like to access on my-chart.   Marta Antu, LPN   07/01/7560  Nurse Health Advisor  Nurse Notes: None

## 2021-02-01 ENCOUNTER — Ambulatory Visit (INDEPENDENT_AMBULATORY_CARE_PROVIDER_SITE_OTHER): Payer: Medicare Other

## 2021-02-01 VITALS — Ht 75.0 in | Wt 200.0 lb

## 2021-02-01 DIAGNOSIS — Z0000 Encounter for general adult medical examination without abnormal findings: Secondary | ICD-10-CM

## 2021-02-01 NOTE — Patient Instructions (Signed)
Jackson Brown , Thank you for taking time to complete your Medicare Wellness Visit. I appreciate your ongoing commitment to your health goals. Please review the following plan we discussed and let me know if I can assist you in the future.   Screening recommendations/referrals: Colonoscopy: Completed 06/16/2013 Recommended yearly ophthalmology/optometry visit for glaucoma screening and checkup Recommended yearly dental visit for hygiene and checkup  Vaccinations: Influenza vaccine: Up to date Pneumococcal vaccine: Due-May obtain vaccine at our office or your local pharmacy. Tdap vaccine: Discuss with pharmacy Shingles vaccine:  Discuss with pharmacy Covid-19: Completed 2 doses. Booster due-May obtain vaccine at your local pharmacy.  Advanced directives: Declined information today  Conditions/risks identified: See problem llist  Next appointment: Follow up in one year for your annual wellness visit.   Preventive Care 66 Years and Older, Male Preventive care refers to lifestyle choices and visits with your health care provider that can promote health and wellness. What does preventive care include?  A yearly physical exam. This is also called an annual well check.  Dental exams once or twice a year.  Routine eye exams. Ask your health care provider how often you should have your eyes checked.  Personal lifestyle choices, including:  Daily care of your teeth and gums.  Regular physical activity.  Eating a healthy diet.  Avoiding tobacco and drug use.  Limiting alcohol use.  Practicing safe sex.  Taking low doses of aspirin every day.  Taking vitamin and mineral supplements as recommended by your health care provider. What happens during an annual well check? The services and screenings done by your health care provider during your annual well check will depend on your age, overall health, lifestyle risk factors, and family history of disease. Counseling  Your health care  provider may ask you questions about your:  Alcohol use.  Tobacco use.  Drug use.  Emotional well-being.  Home and relationship well-being.  Sexual activity.  Eating habits.  History of falls.  Memory and ability to understand (cognition).  Work and work Statistician. Screening  You may have the following tests or measurements:  Height, weight, and BMI.  Blood pressure.  Lipid and cholesterol levels. These may be checked every 5 years, or more frequently if you are over 66 years old.  Skin check.  Lung cancer screening. You may have this screening every year starting at age 66 if you have a 30-pack-year history of smoking and currently smoke or have quit within the past 15 years.  Fecal occult blood test (FOBT) of the stool. You may have this test every year starting at age 66.  Flexible sigmoidoscopy or colonoscopy. You may have a sigmoidoscopy every 5 years or a colonoscopy every 10 years starting at age 66.  Prostate cancer screening. Recommendations will vary depending on your family history and other risks.  Hepatitis C blood test.  Hepatitis B blood test.  Sexually transmitted disease (STD) testing.  Diabetes screening. This is done by checking your blood sugar (glucose) after you have not eaten for a while (fasting). You may have this done every 1-3 years.  Abdominal aortic aneurysm (AAA) screening. You may need this if you are a current or former smoker.  Osteoporosis. You may be screened starting at age 47 if you are at high risk. Talk with your health care provider about your test results, treatment options, and if necessary, the need for more tests. Vaccines  Your health care provider may recommend certain vaccines, such as:  Influenza vaccine. This  is recommended every year.  Tetanus, diphtheria, and acellular pertussis (Tdap, Td) vaccine. You may need a Td booster every 10 years.  Zoster vaccine. You may need this after age 70.  Pneumococcal  13-valent conjugate (PCV13) vaccine. One dose is recommended after age 1.  Pneumococcal polysaccharide (PPSV23) vaccine. One dose is recommended after age 37. Talk to your health care provider about which screenings and vaccines you need and how often you need them. This information is not intended to replace advice given to you by your health care provider. Make sure you discuss any questions you have with your health care provider. Document Released: 10/29/2015 Document Revised: 06/21/2016 Document Reviewed: 08/03/2015 Elsevier Interactive Patient Education  2017 [place] Prevention in the Home Falls can cause injuries. They can happen to people of all ages. There are many things you can do to make your home safe and to help prevent falls. What can I do on the outside of my home?  Regularly fix the edges of walkways and driveways and fix any cracks.  Remove anything that might make you trip as you walk through a door, such as a raised step or threshold.  Trim any bushes or trees on the path to your home.  Use bright outdoor lighting.  Clear any walking paths of anything that might make someone trip, such as rocks or tools.  Regularly check to see if handrails are loose or broken. Make sure that both sides of any steps have handrails.  Any raised decks and porches should have guardrails on the edges.  Have any leaves, snow, or ice cleared regularly.  Use sand or salt on walking paths during winter.  Clean up any spills in your garage right away. This includes oil or grease spills. What can I do in the bathroom?  Use night lights.  Install grab bars by the toilet and in the tub and shower. Do not use towel bars as grab bars.  Use non-skid mats or decals in the tub or shower.  If you need to sit down in the shower, use a plastic, non-slip stool.  Keep the floor dry. Clean up any water that spills on the floor as soon as it happens.  Remove soap buildup in the  tub or shower regularly.  Attach bath mats securely with double-sided non-slip rug tape.  Do not have throw rugs and other things on the floor that can make you trip. What can I do in the bedroom?  Use night lights.  Make sure that you have a light by your bed that is easy to reach.  Do not use any sheets or blankets that are too big for your bed. They should not hang down onto the floor.  Have a firm chair that has side arms. You can use this for support while you get dressed.  Do not have throw rugs and other things on the floor that can make you trip. What can I do in the kitchen?  Clean up any spills right away.  Avoid walking on wet floors.  Keep items that you use a lot in easy-to-reach places.  If you need to reach something above you, use a strong step stool that has a grab bar.  Keep electrical cords out of the way.  Do not use floor polish or wax that makes floors slippery. If you must use wax, use non-skid floor wax.  Do not have throw rugs and other things on the floor that can make you  trip. What can I do with my stairs?  Do not leave any items on the stairs.  Make sure that there are handrails on both sides of the stairs and use them. Fix handrails that are broken or loose. Make sure that handrails are as long as the stairways.  Check any carpeting to make sure that it is firmly attached to the stairs. Fix any carpet that is loose or worn.  Avoid having throw rugs at the top or bottom of the stairs. If you do have throw rugs, attach them to the floor with carpet tape.  Make sure that you have a light switch at the top of the stairs and the bottom of the stairs. If you do not have them, ask someone to add them for you. What else can I do to help prevent falls?  Wear shoes that:  Do not have high heels.  Have rubber bottoms.  Are comfortable and fit you well.  Are closed at the toe. Do not wear sandals.  If you use a stepladder:  Make sure that it  is fully opened. Do not climb a closed stepladder.  Make sure that both sides of the stepladder are locked into place.  Ask someone to hold it for you, if possible.  Clearly mark and make sure that you can see:  Any grab bars or handrails.  First and last steps.  Where the edge of each step is.  Use tools that help you move around (mobility aids) if they are needed. These include:  Canes.  Walkers.  Scooters.  Crutches.  Turn on the lights when you go into a dark area. Replace any light bulbs as soon as they burn out.  Set up your furniture so you have a clear path. Avoid moving your furniture around.  If any of your floors are uneven, fix them.  If there are any pets around you, be aware of where they are.  Review your medicines with your doctor. Some medicines can make you feel dizzy. This can increase your chance of falling. Ask your doctor what other things that you can do to help prevent falls. This information is not intended to replace advice given to you by your health care provider. Make sure you discuss any questions you have with your health care provider. Document Released: 07/29/2009 Document Revised: 03/09/2016 Document Reviewed: 11/06/2014 Elsevier Interactive Patient Education  2017 [employer].

## 2021-03-11 ENCOUNTER — Ambulatory Visit: Payer: Medicare Other | Admitting: Family Medicine

## 2021-04-08 ENCOUNTER — Encounter: Payer: Self-pay | Admitting: Family Medicine

## 2021-04-08 ENCOUNTER — Ambulatory Visit (INDEPENDENT_AMBULATORY_CARE_PROVIDER_SITE_OTHER): Payer: Medicare Other | Admitting: Family Medicine

## 2021-04-08 ENCOUNTER — Other Ambulatory Visit: Payer: Self-pay

## 2021-04-08 VITALS — BP 124/66 | HR 65 | Temp 97.5°F | Ht 75.0 in | Wt 209.0 lb

## 2021-04-08 DIAGNOSIS — E786 Lipoprotein deficiency: Secondary | ICD-10-CM | POA: Diagnosis not present

## 2021-04-08 DIAGNOSIS — Z0000 Encounter for general adult medical examination without abnormal findings: Secondary | ICD-10-CM | POA: Diagnosis not present

## 2021-04-08 DIAGNOSIS — E559 Vitamin D deficiency, unspecified: Secondary | ICD-10-CM | POA: Diagnosis not present

## 2021-04-08 DIAGNOSIS — N401 Enlarged prostate with lower urinary tract symptoms: Secondary | ICD-10-CM | POA: Diagnosis not present

## 2021-04-08 DIAGNOSIS — R3911 Hesitancy of micturition: Secondary | ICD-10-CM

## 2021-04-08 NOTE — Progress Notes (Signed)
Established Patient Office Visit  Subjective:  Patient ID: Jackson Brown, male    DOB: June 16, 1956  Age: 65 y.o. MRN: [id number]  CC:  Chief Complaint  Patient presents with   Annual Exam    CPE, no concerns. Patient not fasting.     HPI Jackson Brown presents for a physical exam and follow-up of vitamin D deficiency, BPH symptoms, low HDL.  Patient with history of low HDL with LDL less than 100.  He does not smoke and has no history of hypertension.  Not exercising as much as he would like to because he has gone back to work part-time.  Tells of some urinary hesitancy, urgency and decreased force of swelling.  There is no nocturia.  Not interested in treatment at this time.  Vitamin D was found to be low several months ago and it was restarted on high-dose weekly pill.  Past Medical History:  Diagnosis Date   Skin cancer     History reviewed. No pertinent surgical history.  Family History  Problem Relation Age of Onset   Cancer Mother    Early death Mother    Diabetes Father    Hearing loss Father    Hypertension Father    Stroke Father    Hypertension Brother    Diabetes Paternal Grandfather     Social History   Socioeconomic History   Marital status: Married    Spouse name: Not on file   Number of children: Not on file   Years of education: Not on file   Highest education level: Not on file  Occupational History   Not on file  Tobacco Use   Smoking status: Never   Smokeless tobacco: Never  Substance and Sexual Activity   Alcohol use: Yes    Comment: occasional   Drug use: No   Sexual activity: Yes    Partners: Female  Other Topics Concern   Not on file  Social History Narrative   Not on file   Social Determinants of Health   Financial Resource Strain: Low Risk    Difficulty of Paying Living Expenses: Not hard at all  Food Insecurity: No Food Insecurity   Worried About Charity fundraiser in the Last Year: Never true   [place] in the Last  Year: Never true  Transportation Needs: No Transportation Needs   Lack of Transportation (Medical): No   Lack of Transportation (Non-Medical): No  Physical Activity: Sufficiently Active   Days of Exercise per Week: 4 days   Minutes of Exercise per Session: 50 min  Stress: No Stress Concern Present   Feeling of Stress : Not at all  Social Connections: Moderately Isolated   Frequency of Communication with Friends and Family: More than three times a week   Frequency of Social Gatherings with Friends and Family: More than three times a week   Attends Religious Services: Never   Marine scientist or Organizations: No   Attends Music therapist: Never   Marital Status: Married  Human resources officer Violence: Not At Risk   Fear of Current or Ex-Partner: No   Emotionally Abused: No   Physically Abused: No   Sexually Abused: No    Outpatient Medications Prior to Visit  Medication Sig Dispense Refill   Multiple Vitamin (MULTIVITAMIN WITH MINERALS) TABS tablet Take 1 tablet by mouth daily.     Vitamin D, Ergocalciferol, (DRISDOL) 1.25 MG (50000 UNIT) CAPS capsule Take 1 capsule (50,000 Units total) by  mouth every 7 (seven) days. 5 capsule 6   carbamide peroxide (DEBROX) 6.5 % OTIC solution 5 drops in each ear twice weekly. 15 mL 0   No facility-administered medications prior to visit.    No Known Allergies  ROS Review of Systems  Constitutional: Negative.   HENT:  Positive for hearing loss.   Respiratory: Negative.    Cardiovascular: Negative.   Gastrointestinal: Negative.   Genitourinary:  Positive for difficulty urinating, frequency and urgency.  Neurological: Negative.   Psychiatric/Behavioral: Negative.       Objective:    Physical Exam Vitals and nursing note reviewed.  Constitutional:      General: He is not in acute distress.    Appearance: Normal appearance. He is normal weight. He is not ill-appearing, toxic-appearing or diaphoretic.  HENT:     Head:  Normocephalic and atraumatic.     Right Ear: Tympanic membrane, ear canal and external ear normal. There is no impacted cerumen.     Left Ear: Tympanic membrane, ear canal and external ear normal. There is no impacted cerumen.     Mouth/Throat:     Mouth: Mucous membranes are dry.     Pharynx: Oropharynx is clear. No oropharyngeal exudate or posterior oropharyngeal erythema.  Eyes:     General: No scleral icterus.       Right eye: No discharge.        Left eye: No discharge.     Extraocular Movements: Extraocular movements intact.     Conjunctiva/sclera: Conjunctivae normal.     Pupils: Pupils are equal, round, and reactive to light.  Neck:     Vascular: No carotid bruit.  Cardiovascular:     Rate and Rhythm: Normal rate and regular rhythm.  Pulmonary:     Effort: Pulmonary effort is normal.     Breath sounds: Normal breath sounds.  Abdominal:     General: Abdomen is flat. Bowel sounds are normal. There is no distension.     Palpations: Abdomen is soft. There is no mass.     Tenderness: There is no abdominal tenderness. There is no guarding or rebound.     Hernia: No hernia is present.  Genitourinary:    Prostate: Enlarged. Not tender and no nodules present.     Rectum: Guaiac result negative. No mass, tenderness, anal fissure, external hemorrhoid or internal hemorrhoid. Normal anal tone.  Musculoskeletal:     Cervical back: No rigidity or tenderness.  Lymphadenopathy:     Cervical: No cervical adenopathy.  Skin:    General: Skin is warm and dry.  Neurological:     Mental Status: He is alert and oriented to person, place, and time.  Psychiatric:        Mood and Affect: Mood normal.        Behavior: Behavior normal.    BP 124/66   Pulse 65   Temp (!) 97.5 F (36.4 C) (Temporal)   Ht 6\' 3"  (1.905 m)   Wt 209 lb (94.8 kg)   SpO2 97%   BMI 26.12 kg/m  Wt Readings from Last 3 Encounters:  04/08/21 209 lb (94.8 kg)  02/01/21 200 lb (90.7 kg)  03/02/20 216 lb 12.8 oz  (98.3 kg)     Health Maintenance Due  Topic Date Due   TETANUS/TDAP  Never done   Zoster Vaccines- Shingrix (1 of 2) Never done   PNA vac Low Risk Adult (1 of 2 - PCV13) Never done    There are no preventive care  reminders to display for this patient.  Lab Results  Component Value Date   TSH 2.01 09/01/2019   Lab Results  Component Value Date   WBC 7.9 09/01/2019   HGB 15.1 09/01/2019   HCT 45.0 09/01/2019   MCV 91.9 09/01/2019   PLT 150.0 09/01/2019   Lab Results  Component Value Date   NA 138 09/01/2019   K 4.5 09/01/2019   CO2 27 09/01/2019   GLUCOSE 101 (H) 09/01/2019   BUN 17 09/01/2019   CREATININE 1.03 09/01/2019   BILITOT 0.5 09/01/2019   ALKPHOS 46 09/01/2019   AST 22 09/01/2019   ALT 25 09/01/2019   PROT 6.7 09/01/2019   ALBUMIN 4.5 09/01/2019   CALCIUM 9.3 09/01/2019   ANIONGAP 11 09/07/2015   GFR 72.53 09/01/2019   Lab Results  Component Value Date   CHOL 148 09/01/2019   Lab Results  Component Value Date   HDL 32.90 (L) 09/01/2019   Lab Results  Component Value Date   LDLCALC 87 09/01/2019   Lab Results  Component Value Date   TRIG 141.0 09/01/2019   Lab Results  Component Value Date   CHOLHDL 4 09/01/2019   No results found for: HGBA1C    Assessment & Plan:   Problem List Items Addressed This Visit       Genitourinary   Benign prostatic hyperplasia with urinary hesitancy   Relevant Orders   CBC   PSA   Urinalysis, Routine w reflex microscopic     Other   Healthcare maintenance - Primary   Vitamin D deficiency   Relevant Orders   VITAMIN D 25 Hydroxy (Vit-D Deficiency, Fractures)   Low HDL (under 40)   Relevant Orders   Comprehensive metabolic panel   Lipid panel    No orders of the defined types were placed in this encounter.   Follow-up: Return in about 6 months (around 10/08/2021).   Given information on health maintenance and disease prevention.  Also given information on BPH.  Declines treatment for BPH at  this time.  Will return fasting for above ordered blood work.  Asked him to work his exercise routine back continue his lifestyle. Libby Maw, MD

## 2021-05-09 ENCOUNTER — Telehealth: Payer: Self-pay

## 2021-05-09 DIAGNOSIS — R7989 Other specified abnormal findings of blood chemistry: Secondary | ICD-10-CM

## 2021-05-09 MED ORDER — VITAMIN D (ERGOCALCIFEROL) 1.25 MG (50000 UNIT) PO CAPS: 50000.0000 [IU] | ORAL_CAPSULE | ORAL | 6 refills | Status: DC

## 2021-05-09 NOTE — Telephone Encounter (Signed)
RX sent to pharmacy. Dm/cma

## 2021-05-09 NOTE — Telephone Encounter (Signed)
[facility] pharmacy called regarding a refilll of Vit D.  Per pharmacist, they have sent multiple requests  Thanks

## 2021-05-27 ENCOUNTER — Other Ambulatory Visit (INDEPENDENT_AMBULATORY_CARE_PROVIDER_SITE_OTHER): Payer: Medicare Other

## 2021-05-27 ENCOUNTER — Other Ambulatory Visit: Payer: Self-pay

## 2021-05-27 DIAGNOSIS — N401 Enlarged prostate with lower urinary tract symptoms: Secondary | ICD-10-CM

## 2021-05-27 DIAGNOSIS — R3911 Hesitancy of micturition: Secondary | ICD-10-CM | POA: Diagnosis not present

## 2021-05-27 DIAGNOSIS — E559 Vitamin D deficiency, unspecified: Secondary | ICD-10-CM

## 2021-05-27 DIAGNOSIS — E786 Lipoprotein deficiency: Secondary | ICD-10-CM

## 2021-05-27 LAB — URINALYSIS, ROUTINE W REFLEX MICROSCOPIC
Bilirubin Urine: NEGATIVE
Hgb urine dipstick: NEGATIVE
Ketones, ur: NEGATIVE
Leukocytes,Ua: NEGATIVE
Nitrite: NEGATIVE
Specific Gravity, Urine: 1.015 (ref 1.000–1.030)
Total Protein, Urine: NEGATIVE
Urine Glucose: NEGATIVE
Urobilinogen, UA: 0.2 (ref 0.0–1.0)
pH: 6.5 (ref 5.0–8.0)

## 2021-05-27 LAB — COMPREHENSIVE METABOLIC PANEL
ALT: 28 U/L (ref 0–53)
AST: 20 U/L (ref 0–37)
Albumin: 4.4 g/dL (ref 3.5–5.2)
Alkaline Phosphatase: 50 U/L (ref 39–117)
BUN: 19 mg/dL (ref 6–23)
CO2: 28 mEq/L (ref 19–32)
Calcium: 9.1 mg/dL (ref 8.4–10.5)
Chloride: 105 mEq/L (ref 96–112)
Creatinine, Ser: 0.98 mg/dL (ref 0.40–1.50)
GFR: 80.36 mL/min (ref >60.00)
Glucose, Bld: 97 mg/dL (ref 70–99)
Potassium: 4.2 mEq/L (ref 3.5–5.1)
Sodium: 140 mEq/L (ref 135–145)
Total Bilirubin: 0.6 mg/dL (ref 0.2–1.2)
Total Protein: 6.6 g/dL (ref 6.0–8.3)

## 2021-05-27 LAB — LIPID PANEL
Cholesterol: 153 mg/dL (ref 0–200)
HDL: 47 mg/dL (ref >39.00)
LDL Cholesterol: 83 mg/dL (ref 0–99)
NonHDL: 106.05
Total CHOL/HDL Ratio: 3
Triglycerides: 117 mg/dL (ref 0.0–149.0)
VLDL: 23.4 mg/dL (ref 0.0–40.0)

## 2021-05-27 LAB — CBC
HCT: 45.7 % (ref 39.0–52.0)
Hemoglobin: 15.2 g/dL (ref 13.0–17.0)
MCHC: 33.3 g/dL (ref 30.0–36.0)
MCV: 91.5 fl (ref 78.0–100.0)
Platelets: 162 10*3/uL (ref 150.0–400.0)
RBC: 4.99 Mil/uL (ref 4.22–5.81)
RDW: 13.3 % (ref 11.5–15.5)
WBC: 4.1 10*3/uL (ref 4.0–10.5)

## 2021-05-27 LAB — PSA: PSA: 4.17 ng/mL — ABNORMAL HIGH (ref 0.10–4.00)

## 2021-05-27 LAB — VITAMIN D 25 HYDROXY (VIT D DEFICIENCY, FRACTURES): VITD: 39.95 ng/mL (ref 30.00–100.00)

## 2021-05-27 NOTE — Progress Notes (Signed)
Labs look good.  PSA is elevated slightly. We will recheck in December.  Vit D levels okay. May stop high dose Vit D tablet and just take a ment's multivitamin.

## 2021-07-05 ENCOUNTER — Encounter: Payer: Self-pay | Admitting: Family Medicine

## 2021-07-07 ENCOUNTER — Other Ambulatory Visit: Payer: Medicare Other

## 2021-07-07 ENCOUNTER — Ambulatory Visit (INDEPENDENT_AMBULATORY_CARE_PROVIDER_SITE_OTHER): Payer: Medicare Other | Admitting: Family Medicine

## 2021-07-07 VITALS — Temp 96.2°F | Ht 75.0 in | Wt 208.0 lb

## 2021-07-07 DIAGNOSIS — R058 Other specified cough: Secondary | ICD-10-CM | POA: Diagnosis not present

## 2021-07-07 DIAGNOSIS — J22 Unspecified acute lower respiratory infection: Secondary | ICD-10-CM | POA: Diagnosis not present

## 2021-07-07 MED ORDER — CLARITHROMYCIN ER 500 MG PO TB24: 1000.0000 mg | ORAL_TABLET | Freq: Every day | ORAL | 0 refills | Status: AC

## 2021-07-07 MED ORDER — BENZONATATE 100 MG PO CAPS: 100.0000 mg | ORAL_CAPSULE | Freq: Two times a day (BID) | ORAL | 0 refills | Status: DC | PRN

## 2021-07-07 MED ORDER — PREDNISONE 20 MG PO TABS: 20.0000 mg | ORAL_TABLET | Freq: Two times a day (BID) | ORAL | 0 refills | Status: AC

## 2021-07-07 NOTE — Progress Notes (Signed)
Established Patient Office Visit  Subjective:  Patient ID: Jackson Brown, male    DOB: 12-02-1955  Age: 65 y.o. MRN: [id number]  CC:  Chief Complaint  Patient presents with   Cough    Cough x 1 week headache that come and go, patient tested positive for COVID 06/28/2021    HPI Croix Presley presents for follow-up of a URI.  Started 12 to 13 days ago.  After few days he tested positive for COVID.  He had developed headache stuffy nose drainage sore throat cough fever and body aches.  He never developed shortness of breath wheezing or difficulty breathing.  He is doing better at this point now.  He just has a lingering cough that is productive of scant yellow phlegm.  Past Medical History:  Diagnosis Date   Skin cancer     History reviewed. No pertinent surgical history.  Family History  Problem Relation Age of Onset   Cancer Mother    Early death Mother    Diabetes Father    Hearing loss Father    Hypertension Father    Stroke Father    Hypertension Brother    Diabetes Paternal Grandfather     Social History   Socioeconomic History   Marital status: Married    Spouse name: Not on file   Number of children: Not on file   Years of education: Not on file   Highest education level: Bachelor's degree (e.g., BA, AB, BS)  Occupational History   Not on file  Tobacco Use   Smoking status: Never   Smokeless tobacco: Never  Substance and Sexual Activity   Alcohol use: Yes    Comment: occasional   Drug use: No   Sexual activity: Yes    Partners: Female  Other Topics Concern   Not on file  Social History Narrative   Not on file   Social Determinants of Health   Financial Resource Strain: Low Risk    Difficulty of Paying Living Expenses: Not hard at all  Food Insecurity: No Food Insecurity   Worried About Charity fundraiser in the Last Year: Never true   [place] in the Last Year: Never true  Transportation Needs: No Transportation Needs   Lack of  Transportation (Medical): No   Lack of Transportation (Non-Medical): No  Physical Activity: Unknown   Days of Exercise per Week: Patient refused   Minutes of Exercise per Session: 50 min  Stress: Stress Concern Present   Feeling of Stress : To some extent  Social Connections: Moderately Isolated   Frequency of Communication with Friends and Family: More than three times a week   Frequency of Social Gatherings with Friends and Family: More than three times a week   Attends Religious Services: Never   Marine scientist or Organizations: No   Attends Music therapist: Never   Marital Status: Married  Human resources officer Violence: Not At Risk   Fear of Current or Ex-Partner: No   Emotionally Abused: No   Physically Abused: No   Sexually Abused: No    Outpatient Medications Prior to Visit  Medication Sig Dispense Refill   Cholecalciferol (VITAMIN D) 125 MCG (5000 UT) CAPS      Multiple Vitamin (MULTIVITAMIN WITH MINERALS) TABS tablet Take 1 tablet by mouth daily.     Vitamin D, Ergocalciferol, (DRISDOL) 1.25 MG (50000 UNIT) CAPS capsule Take 1 capsule (50,000 Units total) by mouth every 7 (seven) days. 5 capsule 6  No facility-administered medications prior to visit.    No Known Allergies  ROS Review of Systems  HENT:  Negative for congestion, postnasal drip and sore throat.   Respiratory:  Positive for cough. Negative for shortness of breath and wheezing.   Gastrointestinal:  Negative for diarrhea.  Neurological:  Positive for headaches.  Psychiatric/Behavioral: Negative.       Objective:    Physical Exam Vitals and nursing note reviewed.  Constitutional:      Appearance: Normal appearance.  HENT:     Head: Normocephalic and atraumatic.  Eyes:     General: No scleral icterus.       Right eye: No discharge.        Left eye: No discharge.     Conjunctiva/sclera: Conjunctivae normal.  Pulmonary:     Effort: Pulmonary effort is normal.     Comments:  Speaking in complete sentences without difficulty.  No obvious difficulty breathing. Skin:    General: Skin is warm and dry.  Neurological:     Mental Status: He is alert and oriented to person, place, and time.  Psychiatric:        Mood and Affect: Mood normal.        Behavior: Behavior normal.    Temp (!) 96.2 F (35.7 C) (Temporal)   Ht 6\' 3"  (1.905 m)   Wt 208 lb (94.3 kg) Comment: per patient  BMI 26.00 kg/m  Wt Readings from Last 3 Encounters:  07/07/21 208 lb (94.3 kg)  04/08/21 209 lb (94.8 kg)  02/01/21 200 lb (90.7 kg)     Health Maintenance Due  Topic Date Due   TETANUS/TDAP  Never done   Zoster Vaccines- Shingrix (1 of 2) Never done    There are no preventive care reminders to display for this patient.  Lab Results  Component Value Date   TSH 2.01 09/01/2019   Lab Results  Component Value Date   WBC 4.1 05/27/2021   HGB 15.2 05/27/2021   HCT 45.7 05/27/2021   MCV 91.5 05/27/2021   PLT 162.0 05/27/2021   Lab Results  Component Value Date   NA 140 05/27/2021   K 4.2 05/27/2021   CO2 28 05/27/2021   GLUCOSE 97 05/27/2021   BUN 19 05/27/2021   CREATININE 0.98 05/27/2021   BILITOT 0.6 05/27/2021   ALKPHOS 50 05/27/2021   AST 20 05/27/2021   ALT 28 05/27/2021   PROT 6.6 05/27/2021   ALBUMIN 4.4 05/27/2021   CALCIUM 9.1 05/27/2021   ANIONGAP 11 09/07/2015   GFR 80.36 05/27/2021   Lab Results  Component Value Date   CHOL 153 05/27/2021   Lab Results  Component Value Date   HDL 47.00 05/27/2021   Lab Results  Component Value Date   LDLCALC 83 05/27/2021   Lab Results  Component Value Date   TRIG 117.0 05/27/2021   Lab Results  Component Value Date   CHOLHDL 3 05/27/2021   No results found for: HGBA1C    Assessment & Plan:   Problem List Items Addressed This Visit       Respiratory   Post-viral cough syndrome   Relevant Medications   predniSONE (DELTASONE) 20 MG tablet   benzonatate (TESSALON) 100 MG capsule   Other  Relevant Orders   DG Chest 2 View   Lower respiratory infection - Primary   Relevant Medications   clarithromycin (BIAXIN XL) 500 MG 24 hr tablet   benzonatate (TESSALON) 100 MG capsule   Other Relevant Orders  DG Chest 2 View    Meds ordered this encounter  Medications   predniSONE (DELTASONE) 20 MG tablet    Sig: Take 1 tablet (20 mg total) by mouth 2 (two) times daily with a meal for 7 days.    Dispense:  14 tablet    Refill:  0   clarithromycin (BIAXIN XL) 500 MG 24 hr tablet    Sig: Take 2 tablets (1,000 mg total) by mouth daily for 7 days.    Dispense:  14 tablet    Refill:  0   benzonatate (TESSALON) 100 MG capsule    Sig: Take 1 capsule (100 mg total) by mouth 2 (two) times daily as needed for cough.    Dispense:  20 capsule    Refill:  0    Follow-up: Return in about 1 week (around 07/14/2021), or if symptoms worsen or fail to improve.   Appointment for chest x-ray.  Follow-up in 1 week if not improving Libby Maw, MD  Virtual Visit via Video Note  I connected with Estus Krakowski on 07/07/21 at  2:30 PM EDT by a video enabled telemedicine application and verified that I am speaking with the correct person using two identifiers.  Location: Patient: home alone in a room.  Provider: work   I discussed the limitations of evaluation and management by telemedicine and the availability of in person appointments. The patient expressed understanding and agreed to proceed.  History of Present Illness:    Observations/Objective:   Assessment and Plan:   Follow Up Instructions:    I discussed the assessment and treatment plan with the patient. The patient was provided an opportunity to ask questions and all were answered. The patient agreed with the plan and demonstrated an understanding of the instructions.   The patient was advised to call back or seek an in-person evaluation if the symptoms worsen or if the condition fails to improve as  anticipated.  I provided 20 minutes of non-face-to-face time during this encounter.   Libby Maw, MD

## 2021-07-08 ENCOUNTER — Other Ambulatory Visit: Payer: Self-pay

## 2021-07-08 ENCOUNTER — Ambulatory Visit (INDEPENDENT_AMBULATORY_CARE_PROVIDER_SITE_OTHER): Payer: Medicare Other

## 2021-07-08 ENCOUNTER — Other Ambulatory Visit: Payer: Medicare Other

## 2021-07-08 DIAGNOSIS — R058 Other specified cough: Secondary | ICD-10-CM | POA: Diagnosis not present

## 2021-07-08 DIAGNOSIS — J22 Unspecified acute lower respiratory infection: Secondary | ICD-10-CM

## 2021-07-08 NOTE — Progress Notes (Unsigned)
000

## 2021-07-21 ENCOUNTER — Other Ambulatory Visit: Payer: Self-pay

## 2021-07-22 ENCOUNTER — Ambulatory Visit (INDEPENDENT_AMBULATORY_CARE_PROVIDER_SITE_OTHER): Payer: Medicare Other | Admitting: Family Medicine

## 2021-07-22 ENCOUNTER — Encounter: Payer: Self-pay | Admitting: Family Medicine

## 2021-07-22 VITALS — BP 130/82 | HR 83 | Temp 97.8°F | Ht 75.0 in | Wt 216.4 lb

## 2021-07-22 DIAGNOSIS — R058 Other specified cough: Secondary | ICD-10-CM

## 2021-07-22 DIAGNOSIS — Z23 Encounter for immunization: Secondary | ICD-10-CM | POA: Diagnosis not present

## 2021-07-22 DIAGNOSIS — R9389 Abnormal findings on diagnostic imaging of other specified body structures: Secondary | ICD-10-CM | POA: Diagnosis not present

## 2021-07-22 NOTE — Progress Notes (Signed)
Established Patient Office Visit  Subjective:  Patient ID: Jackson Brown, male    DOB: 1956-03-16  Age: 65 y.o. MRN: [id number]  CC:  Chief Complaint  Patient presents with   Follow-up    F/u from having Covid, states he is feeling better.       HPI Jackson Brown presents for follow-up of his lower respiratory tract infection with reactive airway disease.  Doing much better.  Denies wheezing or shortness of breath.  There is a cough when he speaks sometimes.  There are no acute fevers or sputum production.  He has no history of asthma.  He has no history of directly smoking.  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  Back to work full-time.  Has not been exercising much.  Denies any shortness of breath associated with walking or exercise in his past.  We discussed the chest x-ray obtained at the last visit that showed evidence for COPD.  Patient has no symptoms of COPD.  He never smoked.  Past Medical History:  Diagnosis Date   Skin cancer     No past surgical history on file.  Family History  Problem Relation Age of Onset   Cancer Mother    Early death Mother    Diabetes Father    Hearing loss Father    Hypertension Father    Stroke Father    Hypertension Brother    Diabetes Paternal Grandfather     Social History   Socioeconomic History   Marital status: Married    Spouse name: Not on file   Number of children: Not on file   Years of education: Not on file   Highest education level: Bachelor's degree (e.g., BA, AB, BS)  Occupational History   Not on file  Tobacco Use   Smoking status: Never   Smokeless tobacco: Never  Substance and Sexual Activity   Alcohol use: Yes    Comment: occasional   Drug use: No   Sexual activity: Yes    Partners: Female  Other Topics Concern   Not on file  Social History Narrative   Not on file   Social Determinants of Health   Financial Resource Strain: Low Risk    Difficulty of  Paying Living Expenses: Not hard at all  Food Insecurity: No Food Insecurity   Worried About Charity fundraiser in the Last Year: Never true   [place] in the Last Year: Never true  Transportation Needs: No Transportation Needs   Lack of Transportation (Medical): No   Lack of Transportation (Non-Medical): No  Physical Activity: Unknown   Days of Exercise per Week: Patient refused   Minutes of Exercise per Session: 50 min  Stress: Stress Concern Present   Feeling of Stress : To some extent  Social Connections: Moderately Isolated   Frequency of Communication with Friends and Family: More than three times a week   Frequency of Social Gatherings with Friends and Family: More than three times a week   Attends Religious Services: Never   Marine scientist or Organizations: No   Attends Music therapist: Never   Marital Status: Married  Human resources officer Violence: Not At Risk   Fear of Current or Ex-Partner: No   Emotionally Abused: No   Physically Abused: No   Sexually Abused: No    Outpatient Medications Prior to Visit  Medication Sig Dispense Refill   Cholecalciferol (VITAMIN D) 125  MCG (5000 UT) CAPS      Multiple Vitamin (MULTIVITAMIN WITH MINERALS) TABS tablet Take 1 tablet by mouth daily.     benzonatate (TESSALON) 100 MG capsule Take 1 capsule (100 mg total) by mouth 2 (two) times daily as needed for cough. (Patient not taking: Reported on 07/22/2021) 20 capsule 0   No facility-administered medications prior to visit.    No Known Allergies  ROS Review of Systems  Constitutional:  Negative for diaphoresis, fatigue, fever and unexpected weight change.  HENT: Negative.    Eyes:  Negative for photophobia and visual disturbance.  Respiratory:  Positive for cough. Negative for chest tightness, shortness of breath and wheezing.   Cardiovascular: Negative.   Gastrointestinal: Negative.   Musculoskeletal:  Negative for arthralgias and myalgias.   Psychiatric/Behavioral: Negative.       Objective:    Physical Exam Vitals and nursing note reviewed.  Constitutional:      General: He is not in acute distress.    Appearance: Normal appearance. He is not ill-appearing, toxic-appearing or diaphoretic.  HENT:     Head: Normocephalic and atraumatic.     Right Ear: External ear normal.     Left Ear: External ear normal.  Eyes:     General: No scleral icterus.       Right eye: No discharge.        Left eye: No discharge.     Conjunctiva/sclera: Conjunctivae normal.     Pupils: Pupils are equal, round, and reactive to light.  Cardiovascular:     Rate and Rhythm: Normal rate and regular rhythm.  Pulmonary:     Effort: Pulmonary effort is normal. No respiratory distress.     Breath sounds: Normal breath sounds. No wheezing, rhonchi or rales.  Abdominal:     General: Bowel sounds are normal.  Musculoskeletal:     Cervical back: No rigidity or tenderness.  Lymphadenopathy:     Cervical: No cervical adenopathy.  Skin:    General: Skin is warm and dry.  Neurological:     Mental Status: He is alert and oriented to person, place, and time.  Psychiatric:        Mood and Affect: Mood normal.        Behavior: Behavior normal.    BP 130/82   Pulse 83   Temp 97.8 F (36.6 C) (Temporal)   Ht 6\' 3"  (1.905 m)   Wt 216 lb 6.4 oz (98.2 kg)   SpO2 98%   BMI 27.05 kg/m  Wt Readings from Last 3 Encounters:  07/22/21 216 lb 6.4 oz (98.2 kg)  07/07/21 208 lb (94.3 kg)  04/08/21 209 lb (94.8 kg)     Health Maintenance Due  Topic Date Due   TETANUS/TDAP  Never done   Zoster Vaccines- Shingrix (1 of 2) Never done    There are no preventive care reminders to display for this patient.  Lab Results  Component Value Date   TSH 2.01 09/01/2019   Lab Results  Component Value Date   WBC 4.1 05/27/2021   HGB 15.2 05/27/2021   HCT 45.7 05/27/2021   MCV 91.5 05/27/2021   PLT 162.0 05/27/2021   Lab Results  Component Value Date    NA 140 05/27/2021   K 4.2 05/27/2021   CO2 28 05/27/2021   GLUCOSE 97 05/27/2021   BUN 19 05/27/2021   CREATININE 0.98 05/27/2021   BILITOT 0.6 05/27/2021   ALKPHOS 50 05/27/2021   AST 20 05/27/2021   ALT  28 05/27/2021   PROT 6.6 05/27/2021   ALBUMIN 4.4 05/27/2021   CALCIUM 9.1 05/27/2021   ANIONGAP 11 09/07/2015   GFR 80.36 05/27/2021   Lab Results  Component Value Date   CHOL 153 05/27/2021   Lab Results  Component Value Date   HDL 47.00 05/27/2021   Lab Results  Component Value Date   LDLCALC 83 05/27/2021   Lab Results  Component Value Date   TRIG 117.0 05/27/2021   Lab Results  Component Value Date   CHOLHDL 3 05/27/2021   No results found for: HGBA1C    Assessment & Plan:   Problem List Items Addressed This Visit       Respiratory   Post-viral cough syndrome   Other Visit Diagnoses     Abnormal chest x-ray    -  Primary   Relevant Orders   Pulmonary function test   Needs flu shot       Relevant Orders   Flu vaccine HIGH DOSE PF (Fluzone High dose)       No orders of the defined types were placed in this encounter.   Follow-up: Return Follow-up for scheduled appointment in December..  Reviewed images of last visits chest x-ray with those obtained a few years ago.  There appeared to be flattening of the diaphragm in both.  We will send patient for pulmonary function testing.  Believe that it will be normal.  We will wait and see.  Flu shot today.  Follow-up elevated PSA in December.  JR after his oh here comes  Libby Maw, MD

## 2021-08-11 ENCOUNTER — Ambulatory Visit: Payer: Medicare Other | Admitting: Family Medicine

## 2021-08-30 ENCOUNTER — Other Ambulatory Visit: Payer: Self-pay

## 2021-08-30 ENCOUNTER — Ambulatory Visit (INDEPENDENT_AMBULATORY_CARE_PROVIDER_SITE_OTHER): Payer: Medicare Other | Admitting: Internal Medicine

## 2021-08-30 DIAGNOSIS — R9389 Abnormal findings on diagnostic imaging of other specified body structures: Secondary | ICD-10-CM

## 2021-08-30 LAB — PULMONARY FUNCTION TEST
DL/VA % pred: 116 %
DL/VA: 4.69 ml/min/mmHg/L
DLCO cor % pred: 102 %
DLCO cor: 31.67 ml/min/mmHg
DLCO unc % pred: 102 %
DLCO unc: 31.67 ml/min/mmHg
FEF 25-75 Post: 3.09 L/sec
FEF 25-75 Pre: 2.01 L/sec
FEF2575-%Change-Post: 53 %
FEF2575-%Pred-Post: 97 %
FEF2575-%Pred-Pre: 63 %
FEV1-%Change-Post: 14 %
FEV1-%Pred-Post: 83 %
FEV1-%Pred-Pre: 72 %
FEV1-Post: 3.39 L
FEV1-Pre: 2.96 L
FEV1FVC-%Change-Post: 3 %
FEV1FVC-%Pred-Pre: 94 %
FEV6-%Change-Post: 10 %
FEV6-%Pred-Post: 89 %
FEV6-%Pred-Pre: 80 %
FEV6-Post: 4.63 L
FEV6-Pre: 4.19 L
FEV6FVC-%Pred-Post: 104 %
FEV6FVC-%Pred-Pre: 104 %
FVC-%Change-Post: 10 %
FVC-%Pred-Post: 85 %
FVC-%Pred-Pre: 77 %
FVC-Post: 4.63 L
FVC-Pre: 4.19 L
Post FEV1/FVC ratio: 73 %
Post FEV6/FVC ratio: 100 %
Pre FEV1/FVC ratio: 71 %
Pre FEV6/FVC Ratio: 100 %
RV % pred: 90 %
RV: 2.38 L
TLC % pred: 82 %
TLC: 6.6 L

## 2021-08-30 NOTE — Patient Instructions (Signed)
Full PFT performed today. °

## 2021-08-30 NOTE — Progress Notes (Signed)
Full PFT performed today. °

## 2021-10-03 ENCOUNTER — Ambulatory Visit: Payer: Medicare Other | Admitting: Family Medicine

## 2021-10-11 ENCOUNTER — Other Ambulatory Visit: Payer: Self-pay

## 2021-10-11 ENCOUNTER — Ambulatory Visit (INDEPENDENT_AMBULATORY_CARE_PROVIDER_SITE_OTHER): Payer: Medicare Other | Admitting: Family Medicine

## 2021-10-11 ENCOUNTER — Encounter: Payer: Self-pay | Admitting: Family Medicine

## 2021-10-11 VITALS — BP 124/72 | HR 60 | Temp 97.4°F | Ht 75.0 in | Wt 232.2 lb

## 2021-10-11 DIAGNOSIS — R972 Elevated prostate specific antigen [PSA]: Secondary | ICD-10-CM | POA: Insufficient documentation

## 2021-10-11 DIAGNOSIS — R9389 Abnormal findings on diagnostic imaging of other specified body structures: Secondary | ICD-10-CM | POA: Diagnosis not present

## 2021-10-11 DIAGNOSIS — R942 Abnormal results of pulmonary function studies: Secondary | ICD-10-CM | POA: Diagnosis not present

## 2021-10-11 NOTE — Progress Notes (Signed)
Established Patient Office Visit  Subjective:  Patient ID: Jackson Brown, male    DOB: 1956-10-14  Age: 65 y.o. MRN: [id number]  CC:  Chief Complaint  Patient presents with   Follow-up    Routine follow up, no concerns.     HPI Jackson Brown presents for follow-up of elevated PSA and unexpected findings with pulmonary function testing.  PFT showed evidence for obstructive and restrictive airway disease albeit minor.  Again he never smoked.  There was smoking in one of his workplaces but he was not directly exposed.  His mother was a heavy smoker and did passed from lung cancer.  He is having no shortness of breath.  Continues to exercise by walking 4 miles daily and riding an exercise bike.  He has never smoked himself.  He has no history of asthma.  Urine flow is normal.  Denies decrease in force of stream or hesitancy.  Experiences nocturia with increased p.m. fluids.  Past Medical History:  Diagnosis Date   Skin cancer     No past surgical history on file.  Family History  Problem Relation Age of Onset   Cancer Mother    Early death Mother    Diabetes Father    Hearing loss Father    Hypertension Father    Stroke Father    Hypertension Brother    Diabetes Paternal Grandfather     Social History   Socioeconomic History   Marital status: Married    Spouse name: Not on file   Number of children: Not on file   Years of education: Not on file   Highest education level: Bachelor's degree (e.g., BA, AB, BS)  Occupational History   Not on file  Tobacco Use   Smoking status: Never   Smokeless tobacco: Never  Substance and Sexual Activity   Alcohol use: Yes    Comment: occasional   Drug use: No   Sexual activity: Yes    Partners: Female  Other Topics Concern   Not on file  Social History Narrative   Not on file   Social Determinants of Health   Financial Resource Strain: Low Risk    Difficulty of Paying Living Expenses: Not hard at all  Food Insecurity: No  Food Insecurity   Worried About Charity fundraiser in the Last Year: Never true   [place] in the Last Year: Never true  Transportation Needs: No Transportation Needs   Lack of Transportation (Medical): No   Lack of Transportation (Non-Medical): No  Physical Activity: Unknown   Days of Exercise per Week: Patient refused   Minutes of Exercise per Session: 50 min  Stress: Stress Concern Present   Feeling of Stress : To some extent  Social Connections: Moderately Isolated   Frequency of Communication with Friends and Family: More than three times a week   Frequency of Social Gatherings with Friends and Family: More than three times a week   Attends Religious Services: Never   Marine scientist or Organizations: No   Attends Music therapist: Never   Marital Status: Married  Human resources officer Violence: Not At Risk   Fear of Current or Ex-Partner: No   Emotionally Abused: No   Physically Abused: No   Sexually Abused: No    Outpatient Medications Prior to Visit  Medication Sig Dispense Refill   Cholecalciferol (VITAMIN D) 125 MCG (5000 UT) CAPS      Multiple Vitamin (MULTIVITAMIN WITH MINERALS) TABS tablet Take  1 tablet by mouth daily.     No facility-administered medications prior to visit.    No Known Allergies  ROS Review of Systems  Constitutional:  Negative for diaphoresis, fatigue, fever and unexpected weight change.  HENT: Negative.    Eyes:  Negative for photophobia and visual disturbance.  Respiratory:  Negative for cough, chest tightness, shortness of breath and wheezing.   Cardiovascular: Negative.   Gastrointestinal: Negative.   Genitourinary: Negative.  Negative for difficulty urinating, frequency and urgency.  Musculoskeletal:  Negative for gait problem and joint swelling.  Neurological:  Negative for speech difficulty and weakness.  Psychiatric/Behavioral: Negative.       Objective:    Physical Exam Vitals and nursing note  reviewed.  Constitutional:      Appearance: Normal appearance.  HENT:     Head: Normocephalic and atraumatic.     Right Ear: External ear normal.     Left Ear: External ear normal.     Mouth/Throat:     Mouth: Mucous membranes are dry.     Pharynx: Oropharynx is clear.  Eyes:     General: No scleral icterus.       Right eye: No discharge.        Left eye: No discharge.     Extraocular Movements: Extraocular movements intact.     Conjunctiva/sclera: Conjunctivae normal.     Pupils: Pupils are equal, round, and reactive to light.  Cardiovascular:     Rate and Rhythm: Normal rate and regular rhythm.  Pulmonary:     Effort: Pulmonary effort is normal. No respiratory distress.     Breath sounds: Normal breath sounds. No wheezing or rales.  Abdominal:     General: Bowel sounds are normal.  Skin:    General: Skin is warm and dry.  Neurological:     Mental Status: He is alert and oriented to person, place, and time.  Psychiatric:        Mood and Affect: Mood normal.        Behavior: Behavior normal.    BP 124/72 (BP Location: Right Arm, Patient Position: Sitting, Cuff Size: Large)    Pulse 60    Temp (!) 97.4 F (36.3 C) (Temporal)    Ht 6\' 3"  (1.905 m)    Wt 232 lb 3.2 oz (105.3 kg)    SpO2 98%    BMI 29.02 kg/m  Wt Readings from Last 3 Encounters:  10/11/21 232 lb 3.2 oz (105.3 kg)  07/22/21 216 lb 6.4 oz (98.2 kg)  07/07/21 208 lb (94.3 kg)     Health Maintenance Due  Topic Date Due   TETANUS/TDAP  Never done   Zoster Vaccines- Shingrix (1 of 2) Never done   Pneumonia Vaccine 21+ Years old (1 - PCV) Never done   COVID-19 Vaccine (4 - Booster for Moderna series) 04/19/2021    There are no preventive care reminders to display for this patient.  Lab Results  Component Value Date   TSH 2.01 09/01/2019   Lab Results  Component Value Date   WBC 4.1 05/27/2021   HGB 15.2 05/27/2021   HCT 45.7 05/27/2021   MCV 91.5 05/27/2021   PLT 162.0 05/27/2021   Lab Results   Component Value Date   NA 140 05/27/2021   K 4.2 05/27/2021   CO2 28 05/27/2021   GLUCOSE 97 05/27/2021   BUN 19 05/27/2021   CREATININE 0.98 05/27/2021   BILITOT 0.6 05/27/2021   ALKPHOS 50 05/27/2021   AST 20  05/27/2021   ALT 28 05/27/2021   PROT 6.6 05/27/2021   ALBUMIN 4.4 05/27/2021   CALCIUM 9.1 05/27/2021   ANIONGAP 11 09/07/2015   GFR 80.36 05/27/2021   Lab Results  Component Value Date   CHOL 153 05/27/2021   Lab Results  Component Value Date   HDL 47.00 05/27/2021   Lab Results  Component Value Date   LDLCALC 83 05/27/2021   Lab Results  Component Value Date   TRIG 117.0 05/27/2021   Lab Results  Component Value Date   CHOLHDL 3 05/27/2021   No results found for: HGBA1C    Assessment & Plan:   Problem List Items Addressed This Visit       Other   Elevated PSA - Primary   Relevant Orders   PSA   Abnormal PFTs (pulmonary function tests)   Abnormal chest x-ray   Relevant Orders   DG Chest 2 View   Alpha-1-antitrypsin    No orders of the defined types were placed in this encounter.   Follow-up: Return in about 6 months (around 04/11/2022), or Routinely scheduled physical and follow-up..  We will follow-up chest x-ray in 1 year.  It was ordered today.  May consider repeating PFTs.  He is asymptomatic concerning asthma or COPD.  We will continue to follow.  Following up elevated PSA today.  Libby Maw, MD

## 2021-10-12 LAB — PSA: PSA: 4.31 ng/mL — ABNORMAL HIGH (ref 0.10–4.00)

## 2021-10-12 LAB — ALPHA-1-ANTITRYPSIN: A-1 Antitrypsin, Ser: 131 mg/dL (ref 83–199)

## 2022-02-08 ENCOUNTER — Ambulatory Visit (INDEPENDENT_AMBULATORY_CARE_PROVIDER_SITE_OTHER): Payer: Medicare Other

## 2022-02-08 DIAGNOSIS — Z0000 Encounter for general adult medical examination without abnormal findings: Secondary | ICD-10-CM | POA: Diagnosis not present

## 2022-02-08 NOTE — Patient Instructions (Signed)
Mr. Givan , ?Thank you for taking time to come for your Medicare Wellness Visit. I appreciate your ongoing commitment to your health goals. Please review the following plan we discussed and let me know if I can assist you in the future.  ? ?Screening recommendations/referrals: ?Colonoscopy: 06/16/2013  due 2024 ?Recommended yearly ophthalmology/optometry visit for glaucoma screening and checkup ?Recommended yearly dental visit for hygiene and checkup ? ?Vaccinations: ?Influenza vaccine: completed  ?Pneumococcal vaccine: due  ?Tdap vaccine: due  ?Shingles vaccine: will consider    ? ?Advanced directives: none  ? ?Conditions/risks identified: none  ? ?Next appointment: none  ? ?Preventive Care 67 Years and Older, Male ?Preventive care refers to lifestyle choices and visits with your health care provider that can promote health and wellness. ?What does preventive care include? ?A yearly physical exam. This is also called an annual well check. ?Dental exams once or twice a year. ?Routine eye exams. Ask your health care provider how often you should have your eyes checked. ?Personal lifestyle choices, including: ?Daily care of your teeth and gums. ?Regular physical activity. ?Eating a healthy diet. ?Avoiding tobacco and drug use. ?Limiting alcohol use. ?Practicing safe sex. ?Taking low doses of aspirin every day. ?Taking vitamin and mineral supplements as recommended by your health care provider. ?What happens during an annual well check? ?The services and screenings done by your health care provider during your annual well check will depend on your age, overall health, lifestyle risk factors, and family history of disease. ?Counseling  ?Your health care provider may ask you questions about your: ?Alcohol use. ?Tobacco use. ?Drug use. ?Emotional well-being. ?Home and relationship well-being. ?Sexual activity. ?Eating habits. ?History of falls. ?Memory and ability to understand (cognition). ?Work and work  Statistician. ?Screening  ?You may have the following tests or measurements: ?Height, weight, and BMI. ?Blood pressure. ?Lipid and cholesterol levels. These may be checked every 5 years, or more frequently if you are over 34 years old. ?Skin check. ?Lung cancer screening. You may have this screening every year starting at age 49 if you have a 30-pack-year history of smoking and currently smoke or have quit within the past 15 years. ?Fecal occult blood test (FOBT) of the stool. You may have this test every year starting at age 27. ?Flexible sigmoidoscopy or colonoscopy. You may have a sigmoidoscopy every 5 years or a colonoscopy every 10 years starting at age 20. ?Prostate cancer screening. Recommendations will vary depending on your family history and other risks. ?Hepatitis C blood test. ?Hepatitis B blood test. ?Sexually transmitted disease (STD) testing. ?Diabetes screening. This is done by checking your blood sugar (glucose) after you have not eaten for a while (fasting). You may have this done every 1-3 years. ?Abdominal aortic aneurysm (AAA) screening. You may need this if you are a current or former smoker. ?Osteoporosis. You may be screened starting at age 91 if you are at high risk. ?Talk with your health care provider about your test results, treatment options, and if necessary, the need for more tests. ?Vaccines  ?Your health care provider may recommend certain vaccines, such as: ?Influenza vaccine. This is recommended every year. ?Tetanus, diphtheria, and acellular pertussis (Tdap, Td) vaccine. You may need a Td booster every 10 years. ?Zoster vaccine. You may need this after age 34. ?Pneumococcal 13-valent conjugate (PCV13) vaccine. One dose is recommended after age 64. ?Pneumococcal polysaccharide (PPSV23) vaccine. One dose is recommended after age 13. ?Talk to your health care provider about which screenings and vaccines you need  and how often you need them. ?This information is not intended to replace  advice given to you by your health care provider. Make sure you discuss any questions you have with your health care provider. ?Document Released: 10/29/2015 Document Revised: 06/21/2016 Document Reviewed: 08/03/2015 ?Elsevier Interactive Patient Education ? 2017 [place]. ? ?Fall Prevention in the Home ?Falls can cause injuries. They can happen to people of all ages. There are many things you can do to make your home safe and to help prevent falls. ?What can I do on the outside of my home? ?Regularly fix the edges of walkways and driveways and fix any cracks. ?Remove anything that might make you trip as you walk through a door, such as a raised step or threshold. ?Trim any bushes or trees on the path to your home. ?Use bright outdoor lighting. ?Clear any walking paths of anything that might make someone trip, such as rocks or tools. ?Regularly check to see if handrails are loose or broken. Make sure that both sides of any steps have handrails. ?Any raised decks and porches should have guardrails on the edges. ?Have any leaves, snow, or ice cleared regularly. ?Use sand or salt on walking paths during winter. ?Clean up any spills in your garage right away. This includes oil or grease spills. ?What can I do in the bathroom? ?Use night lights. ?Install grab bars by the toilet and in the tub and shower. Do not use towel bars as grab bars. ?Use non-skid mats or decals in the tub or shower. ?If you need to sit down in the shower, use a plastic, non-slip stool. ?Keep the floor dry. Clean up any water that spills on the floor as soon as it happens. ?Remove soap buildup in the tub or shower regularly. ?Attach bath mats securely with double-sided non-slip rug tape. ?Do not have throw rugs and other things on the floor that can make you trip. ?What can I do in the bedroom? ?Use night lights. ?Make sure that you have a light by your bed that is easy to reach. ?Do not use any sheets or blankets that are too big for your bed.  They should not hang down onto the floor. ?Have a firm chair that has side arms. You can use this for support while you get dressed. ?Do not have throw rugs and other things on the floor that can make you trip. ?What can I do in the kitchen? ?Clean up any spills right away. ?Avoid walking on wet floors. ?Keep items that you use a lot in easy-to-reach places. ?If you need to reach something above you, use a strong step stool that has a grab bar. ?Keep electrical cords out of the way. ?Do not use floor polish or wax that makes floors slippery. If you must use wax, use non-skid floor wax. ?Do not have throw rugs and other things on the floor that can make you trip. ?What can I do with my stairs? ?Do not leave any items on the stairs. ?Make sure that there are handrails on both sides of the stairs and use them. Fix handrails that are broken or loose. Make sure that handrails are as long as the stairways. ?Check any carpeting to make sure that it is firmly attached to the stairs. Fix any carpet that is loose or worn. ?Avoid having throw rugs at the top or bottom of the stairs. If you do have throw rugs, attach them to the floor with carpet tape. ?Make sure that you  have a light switch at the top of the stairs and the bottom of the stairs. If you do not have them, ask someone to add them for you. ?What else can I do to help prevent falls? ?Wear shoes that: ?Do not have high heels. ?Have rubber bottoms. ?Are comfortable and fit you well. ?Are closed at the toe. Do not wear sandals. ?If you use a stepladder: ?Make sure that it is fully opened. Do not climb a closed stepladder. ?Make sure that both sides of the stepladder are locked into place. ?Ask someone to hold it for you, if possible. ?Clearly mark and make sure that you can see: ?Any grab bars or handrails. ?First and last steps. ?Where the edge of each step is. ?Use tools that help you move around (mobility aids) if they are needed. These  include: ?Canes. ?Walkers. ?Scooters. ?Crutches. ?Turn on the lights when you go into a dark area. Replace any light bulbs as soon as they burn out. ?Set up your furniture so you have a clear path. Avoid moving your furniture around. ?If any

## 2022-02-08 NOTE — Progress Notes (Signed)
? ?Subjective:  ? Jackson Brown is a 66 y.o. male who presents for an Subsequent  Medicare Annual Wellness Visit. ? ?Review of Systems    ? ?Cardiac Risk Factors include: advanced age (>58mn, >>46women);male gender ? ?   ?Objective:  ?  ?Today's Vitals  ? ?There is no height or weight on file to calculate BMI. ? ? ?  02/08/2022  ?  8:21 AM 02/01/2021  ?  8:21 AM 09/07/2015  ?  5:21 PM  ?Advanced Directives  ?Does Patient Have a Medical Advance Directive? No No No  ?Would patient like information on creating a medical advance directive? No - Patient declined No - Patient declined   ? ? ?Current Medications (verified) ?Outpatient Encounter Medications as of 02/08/2022  ?Medication Sig  ? Cholecalciferol (VITAMIN D) 125 MCG (5000 UT) CAPS   ? Multiple Vitamin (MULTIVITAMIN WITH MINERALS) TABS tablet Take 1 tablet by mouth daily.  ? ?No facility-administered encounter medications on file as of 02/08/2022.  ? ? ?Allergies (verified) ?Patient has no known allergies.  ? ?History: ?Past Medical History:  ?Diagnosis Date  ? Skin cancer   ? ?History reviewed. No pertinent surgical history. ?Family History  ?Problem Relation Age of Onset  ? Cancer Mother   ? Early death Mother   ? Diabetes Father   ? Hearing loss Father   ? Hypertension Father   ? Stroke Father   ? Hypertension Brother   ? Diabetes Paternal Grandfather   ? ?Social History  ? ?Socioeconomic History  ? Marital status: Married  ?  Spouse name: Not on file  ? Number of children: Not on file  ? Years of education: Not on file  ? Highest education level: Bachelor's degree (e.g., BA, AB, BS)  ?Occupational History  ? Not on file  ?Tobacco Use  ? Smoking status: Never  ? Smokeless tobacco: Never  ?Substance and Sexual Activity  ? Alcohol use: Yes  ?  Comment: occasional  ? Drug use: No  ? Sexual activity: Yes  ?  Partners: Female  ?Other Topics Concern  ? Not on file  ?Social History Narrative  ? Not on file  ? ?Social Determinants of Health  ? ?Financial Resource  Strain: Low Risk   ? Difficulty of Paying Living Expenses: Not hard at all  ?Food Insecurity: No Food Insecurity  ? Worried About RCharity fundraiserin the Last Year: Never true  ? Ran Out of Food in the Last Year: Never true  ?Transportation Needs: No Transportation Needs  ? Lack of Transportation (Medical): No  ? Lack of Transportation (Non-Medical): No  ?Physical Activity: Sufficiently Active  ? Days of Exercise per Week: 5 days  ? Minutes of Exercise per Session: 40 min  ?Stress: No Stress Concern Present  ? Feeling of Stress : Not at all  ?Social Connections: Moderately Isolated  ? Frequency of Communication with Friends and Family: Three times a week  ? Frequency of Social Gatherings with Friends and Family: Three times a week  ? Attends Religious Services: Never  ? Active Member of Clubs or Organizations: No  ? Attends CArchivistMeetings: Never  ? Marital Status: Married  ? ? ?Tobacco Counseling ?Counseling given: Not Answered ? ? ?Clinical Intake: ? ?Pre-visit preparation completed: Yes ? ?Pain : No/denies pain ? ?  ? ?Nutritional Risks: None ?Diabetes: No ? ?How often do you need to have someone help you when you read instructions, pamphlets, or other written materials from your  doctor or pharmacy?: 1 - Never ?What is the last grade level you completed in school?: High School ? ?Diabetic?no  ? ?Interpreter Needed?: No ? ?Information entered by :: [email or web address] ? ? ?Activities of Daily Living ? ?  02/08/2022  ?  8:25 AM  ?In your present state of health, do you have any difficulty performing the following activities:  ?Hearing? 0  ?Vision? 0  ?Difficulty concentrating or making decisions? 0  ?Walking or climbing stairs? 0  ?Dressing or bathing? 0  ?Doing errands, shopping? 0  ?Preparing Food and eating ? N  ?Using the Toilet? N  ?In the past six months, have you accidently leaked urine? N  ?Do you have problems with loss of bowel control? N  ?Managing your Medications? N  ?Managing your Finances? N   ?Housekeeping or managing your Housekeeping? N  ? ? ?Patient Care Team: ?Libby Maw, MD as PCP - General (Family Medicine) ? ?Indicate any recent Medical Services you may have received from other than [facility] providers in the past year (date may be approximate). ? ?   ?Assessment:  ? This is a routine wellness examination for Turner. ? ?Hearing/Vision screen ?Vision Screening - Comments:: Annual eye exams wears glasses/contacts  ? ?Dietary issues and exercise activities discussed: ?Current Exercise Habits: Home exercise routine, Type of exercise: walking, Time (Minutes): 40, Frequency (Times/Week): 5, Weekly Exercise (Minutes/Week): 200, Intensity: Mild, Exercise limited by: None identified ? ? Goals Addressed   ? ?  ?  ?  ?  ? This Visit's Progress  ?  Patient Stated   On track  ?  Maintain current healthy active lifestyle ?  ? ?  ? ?Depression Screen ? ?  02/08/2022  ?  8:22 AM 02/08/2022  ?  8:20 AM 10/11/2021  ?  2:01 PM 07/07/2021  ?  2:36 PM 04/08/2021  ?  1:51 PM 02/01/2021  ?  8:24 AM 09/26/2017  ? 11:11 AM  ?PHQ 2/9 Scores  ?PHQ - 2 Score 0 0 0 0 0 0 0  ?  ?Fall Risk ? ?  02/08/2022  ?  8:22 AM 10/11/2021  ?  2:01 PM 07/07/2021  ?  2:36 PM 04/08/2021  ?  1:51 PM 02/01/2021  ?  8:23 AM  ?Fall Risk   ?Falls in the past year? 0 0 0 0 0  ?Number falls in past yr: 0 0 0  0  ?Injury with Fall? 0    0  ?Follow up Falls evaluation completed    Falls prevention discussed  ? ? ?FALL RISK PREVENTION PERTAINING TO THE HOME: ? ?Any stairs in or around the home? Yes  ?If so, are there any without handrails? No  ?Home free of loose throw rugs in walkways, pet beds, electrical cords, etc? Yes  ?Adequate lighting in your home to reduce risk of falls? Yes  ? ?ASSISTIVE DEVICES UTILIZED TO PREVENT FALLS: ? ?Life alert? No  ?Use of a cane, walker or w/c? No  ?Grab bars in the bathroom? No  ?Shower chair or bench in shower? No  ?Elevated toilet seat or a handicapped toilet? No  ? ? ? ?Cognitive Function: ? Normal cognitive  status assessed telephone conversation by this Nurse Health Advisor. No abnormalities found.  ? ?  ?  ? ?Immunizations ?Immunization History  ?Administered Date(s) Administered  ? Influenza Inj Mdck Quad Pf 08/01/2018  ? Influenza, High Dose Seasonal PF 07/22/2021  ? Influenza,inj,Quad PF,6+ Mos 09/01/2019  ? Influenza-Unspecified 08/15/2017, 07/21/2020  ? Moderna  Sars-Covid-2 Vaccination 07/25/2020, 08/20/2020, 02/22/2021  ? ? ?TDAP status: Due, Education has been provided regarding the importance of this vaccine. Advised may receive this vaccine at local pharmacy or Health Dept. Aware to provide a copy of the vaccination record if obtained from local pharmacy or Health Dept. Verbalized acceptance and understanding. ? ?Flu Vaccine status: Up to date ? ?Pneumococcal vaccine status: Due, Education has been provided regarding the importance of this vaccine. Advised may receive this vaccine at local pharmacy or Health Dept. Aware to provide a copy of the vaccination record if obtained from local pharmacy or Health Dept. Verbalized acceptance and understanding. ? ?Covid-19 vaccine status: Completed vaccines ? ?Qualifies for Shingles Vaccine? Yes   ?Zostavax completed No   ?Shingrix Completed?: No.    Education has been provided regarding the importance of this vaccine. Patient has been advised to call insurance company to determine out of pocket expense if they have not yet received this vaccine. Advised may also receive vaccine at local pharmacy or Health Dept. Verbalized acceptance and understanding. ? ?Screening Tests ?Health Maintenance  ?Topic Date Due  ? TETANUS/TDAP  Never done  ? Zoster Vaccines- Shingrix (1 of 2) Never done  ? Pneumonia Vaccine 65+ Years old (1 - PCV) Never done  ? COVID-19 Vaccine (4 - Booster for Moderna series) 04/19/2021  ? INFLUENZA VACCINE  05/16/2022  ? COLONOSCOPY (Pts 45-37yr Insurance coverage will need to be confirmed)  06/17/2023  ? Hepatitis C Screening  Completed  ? HPV VACCINES   Aged Out  ? ? ?Health Maintenance ? ?Health Maintenance Due  ?Topic Date Due  ? TETANUS/TDAP  Never done  ? Zoster Vaccines- Shingrix (1 of 2) Never done  ? Pneumonia Vaccine 67 Years old (1 - PCV) Never

## 2022-04-11 ENCOUNTER — Ambulatory Visit (INDEPENDENT_AMBULATORY_CARE_PROVIDER_SITE_OTHER): Payer: Medicare Other

## 2022-04-11 ENCOUNTER — Ambulatory Visit (INDEPENDENT_AMBULATORY_CARE_PROVIDER_SITE_OTHER): Payer: Medicare Other | Admitting: Family Medicine

## 2022-04-11 ENCOUNTER — Other Ambulatory Visit: Payer: Medicare Other

## 2022-04-11 ENCOUNTER — Encounter: Payer: Self-pay | Admitting: Family Medicine

## 2022-04-11 VITALS — BP 134/68 | HR 51 | Temp 97.8°F | Ht 75.0 in | Wt 236.2 lb

## 2022-04-11 DIAGNOSIS — R9389 Abnormal findings on diagnostic imaging of other specified body structures: Secondary | ICD-10-CM

## 2022-04-11 DIAGNOSIS — R972 Elevated prostate specific antigen [PSA]: Secondary | ICD-10-CM | POA: Diagnosis not present

## 2022-04-11 DIAGNOSIS — E559 Vitamin D deficiency, unspecified: Secondary | ICD-10-CM | POA: Diagnosis not present

## 2022-04-11 DIAGNOSIS — J22 Unspecified acute lower respiratory infection: Secondary | ICD-10-CM | POA: Diagnosis not present

## 2022-04-11 LAB — PSA: PSA: 6.41 ng/mL — ABNORMAL HIGH (ref 0.10–4.00)

## 2022-04-11 LAB — VITAMIN D 25 HYDROXY (VIT D DEFICIENCY, FRACTURES): VITD: 56.67 ng/mL (ref 30.00–100.00)

## 2022-04-11 MED ORDER — AZITHROMYCIN 250 MG PO TABS: ORAL_TABLET | ORAL | 0 refills | Status: AC

## 2022-04-11 NOTE — Progress Notes (Unsigned)
Initial visit for a cough, productive of yellow phlegm. No fever. Denies wheezing or difficulty breathing. Denies SOB

## 2022-04-11 NOTE — Progress Notes (Addendum)
Established Patient Office Visit  Subjective   Patient ID: Jackson Brown, male    DOB: 29-Apr-1956  Age: 66 y.o. MRN: [id number]  Chief Complaint  Patient presents with   Cough    6 month follow up, patient not feeling well today cough, sore throat, headache and fever x 6 days. Negative test for COVID.     Cough Pertinent negatives include no eye redness, hemoptysis, myalgias, rash, shortness of breath or wheezing.   follow-up of vitamin D deficiency, elevated PSA and now with GI history of URI signs and symptoms that have left him with a cough productive of yellow phlegm.  No recent fevers chills.  Denies wheezing or difficulty breathing.  Denies shortness of breath.  Urine flow is okay with some diminished force of stream.  There is no nocturia.  Continues to exercise by walking per normal.  Continues vitamin D supplementation.    Review of Systems  Constitutional: Negative.   HENT: Negative.    Eyes:  Negative for blurred vision, discharge and redness.  Respiratory:  Positive for cough and sputum production. Negative for hemoptysis, shortness of breath and wheezing.   Cardiovascular: Negative.   Gastrointestinal:  Negative for abdominal pain.  Genitourinary: Negative.   Musculoskeletal: Negative.  Negative for myalgias.  Skin:  Negative for rash.  Neurological:  Negative for tingling, loss of consciousness and weakness.  Endo/Heme/Allergies:  Negative for polydipsia.      Objective:     BP 134/68 (BP Location: Right Arm, Patient Position: Sitting, Cuff Size: Large)   Pulse (!) 51   Temp 97.8 F (36.6 C) (Temporal)   Ht 6\' 3"  (1.905 m)   Wt 236 lb 3.2 oz (107.1 kg)   SpO2 94%   BMI 29.52 kg/m    Physical Exam Constitutional:      General: He is not in acute distress.    Appearance: Normal appearance. He is not ill-appearing, toxic-appearing or diaphoretic.  HENT:     Head: Normocephalic and atraumatic.     Right Ear: External ear normal.     Left Ear:  External ear normal.     Mouth/Throat:     Mouth: Mucous membranes are moist.     Pharynx: Oropharynx is clear. No oropharyngeal exudate or posterior oropharyngeal erythema.  Eyes:     General: No scleral icterus.       Right eye: No discharge.        Left eye: No discharge.     Extraocular Movements: Extraocular movements intact.     Conjunctiva/sclera: Conjunctivae normal.     Pupils: Pupils are equal, round, and reactive to light.  Cardiovascular:     Rate and Rhythm: Normal rate and regular rhythm.  Pulmonary:     Effort: Pulmonary effort is normal. No respiratory distress.     Breath sounds: Decreased air movement present. No wheezing or rales.  Abdominal:     General: Bowel sounds are normal.  Musculoskeletal:     Cervical back: No rigidity or tenderness.  Skin:    General: Skin is warm and dry.  Neurological:     Mental Status: He is alert and oriented to person, place, and time.  Psychiatric:        Mood and Affect: Mood normal.        Behavior: Behavior normal.      Results for orders placed or performed in visit on 04/11/22  PSA  Result Value Ref Range   PSA 6.41 (H) 0.10 - 4.00 ng/mL  VITAMIN D 25 Hydroxy (Vit-D Deficiency, Fractures)  Result Value Ref Range   VITD 56.67 30.00 - 100.00 ng/mL      The 10-year ASCVD risk score (Arnett DK, et al., 2019) is: 13.9%    Assessment & Plan:   Problem List Items Addressed This Visit       Respiratory   Lower respiratory infection - Primary   Relevant Medications   azithromycin (ZITHROMAX) 250 MG tablet   Other Relevant Orders   CBC   DG Chest 2 View     Other   Vitamin D deficiency, unspecified   Relevant Orders   VITAMIN D 25 Hydroxy (Vit-D Deficiency, Fractures) (Completed)   Elevated PSA   Relevant Orders   PSA (Completed)   MR PROSTATE W WO CONTRAST   Ambulatory referral to Urology   Abnormal chest x-ray   Relevant Orders   DG Chest 2 View    Return in about 6 months (around 10/11/2022), or  if symptoms worsen or fail to improve.    Mliss Sax, MD

## 2022-04-19 ENCOUNTER — Encounter: Payer: Self-pay | Admitting: Family Medicine

## 2022-04-21 ENCOUNTER — Other Ambulatory Visit (INDEPENDENT_AMBULATORY_CARE_PROVIDER_SITE_OTHER): Payer: Medicare Other

## 2022-04-21 DIAGNOSIS — J22 Unspecified acute lower respiratory infection: Secondary | ICD-10-CM | POA: Diagnosis not present

## 2022-04-21 LAB — CBC
HCT: 44.8 % (ref 39.0–52.0)
Hemoglobin: 15.1 g/dL (ref 13.0–17.0)
MCHC: 33.6 g/dL (ref 30.0–36.0)
MCV: 91 fl (ref 78.0–100.0)
Platelets: 99 10*3/uL — ABNORMAL LOW (ref 150.0–400.0)
RBC: 4.92 Mil/uL (ref 4.22–5.81)
RDW: 13.5 % (ref 11.5–15.5)
WBC: 8.6 10*3/uL (ref 4.0–10.5)

## 2022-04-23 ENCOUNTER — Encounter: Payer: Self-pay | Admitting: Family Medicine

## 2022-04-23 DIAGNOSIS — D696 Thrombocytopenia, unspecified: Secondary | ICD-10-CM

## 2022-04-24 NOTE — Telephone Encounter (Signed)
Hello would someone be able to check and see if patients CBC was sent and ran on a blue top tube? Please advise

## 2022-04-26 ENCOUNTER — Other Ambulatory Visit: Payer: Self-pay

## 2022-04-27 ENCOUNTER — Other Ambulatory Visit (INDEPENDENT_AMBULATORY_CARE_PROVIDER_SITE_OTHER): Payer: Medicare Other

## 2022-04-27 DIAGNOSIS — D696 Thrombocytopenia, unspecified: Secondary | ICD-10-CM | POA: Diagnosis not present

## 2022-04-27 LAB — CBC WITH DIFFERENTIAL/PLATELET
Basophils Absolute: 0.1 10*3/uL (ref 0.0–0.1)
Basophils Relative: 1.3 % (ref 0.0–3.0)
Eosinophils Absolute: 0.1 10*3/uL (ref 0.0–0.7)
Eosinophils Relative: 1.3 % (ref 0.0–5.0)
HCT: 44.9 % (ref 39.0–52.0)
Hemoglobin: 14.8 g/dL (ref 13.0–17.0)
Lymphocytes Relative: 23.2 % (ref 12.0–46.0)
Lymphs Abs: 1.4 10*3/uL (ref 0.7–4.0)
MCHC: 33 g/dL (ref 30.0–36.0)
MCV: 90.3 fl (ref 78.0–100.0)
Monocytes Absolute: 0.6 10*3/uL (ref 0.1–1.0)
Monocytes Relative: 9.5 % (ref 3.0–12.0)
Neutro Abs: 3.8 10*3/uL (ref 1.4–7.7)
Neutrophils Relative %: 64.7 % (ref 43.0–77.0)
Platelets: 212 10*3/uL (ref 150.0–400.0)
RBC: 4.97 Mil/uL (ref 4.22–5.81)
RDW: 13.7 % (ref 11.5–15.5)
WBC: 5.6 10*3/uL (ref 4.0–10.5)

## 2022-04-28 NOTE — Telephone Encounter (Signed)
Pt has been contact and told to go to main lab to have blood collect so that it can be tested immediately Santiago Glad at main lab has been notified pt will be stopping by.

## 2022-05-05 ENCOUNTER — Ambulatory Visit
Admission: RE | Admit: 2022-05-05 | Discharge: 2022-05-05 | Disposition: A | Discharge status: Home / Self Care | Payer: Medicare Other | Source: Ambulatory Visit | Attending: Family Medicine | Admitting: Family Medicine

## 2022-05-05 DIAGNOSIS — R972 Elevated prostate specific antigen [PSA]: Secondary | ICD-10-CM

## 2022-05-05 MED ORDER — GADOBENATE DIMEGLUMINE 529 MG/ML IV SOLN
20.0000 mL | Freq: Once | INTRAVENOUS | Status: AC | PRN
  Administered 2022-05-05: 20 mL via INTRAVENOUS

## 2022-05-09 ENCOUNTER — Encounter: Payer: Self-pay | Admitting: Family Medicine

## 2022-10-06 ENCOUNTER — Encounter: Payer: Self-pay | Admitting: Family Medicine

## 2022-10-11 ENCOUNTER — Ambulatory Visit: Payer: Medicare Other | Admitting: Family Medicine

## 2022-10-24 ENCOUNTER — Encounter: Payer: Self-pay | Admitting: Family Medicine

## 2022-10-24 ENCOUNTER — Ambulatory Visit (INDEPENDENT_AMBULATORY_CARE_PROVIDER_SITE_OTHER): Payer: Medicare Other | Admitting: Family Medicine

## 2022-10-24 VITALS — BP 124/72 | HR 56 | Temp 97.6°F | Ht 75.0 in | Wt 242.8 lb

## 2022-10-24 DIAGNOSIS — Z23 Encounter for immunization: Secondary | ICD-10-CM | POA: Diagnosis not present

## 2022-10-24 DIAGNOSIS — C61 Malignant neoplasm of prostate: Secondary | ICD-10-CM | POA: Diagnosis not present

## 2022-10-24 DIAGNOSIS — R001 Bradycardia, unspecified: Secondary | ICD-10-CM | POA: Insufficient documentation

## 2022-10-24 DIAGNOSIS — J22 Unspecified acute lower respiratory infection: Secondary | ICD-10-CM

## 2022-10-24 LAB — CBC
HCT: 46.2 % (ref 39.0–52.0)
Hemoglobin: 15.4 g/dL (ref 13.0–17.0)
MCHC: 33.4 g/dL (ref 30.0–36.0)
MCV: 90.2 fl (ref 78.0–100.0)
RBC: 5.12 Mil/uL (ref 4.22–5.81)
RDW: 13.5 % (ref 11.5–15.5)
WBC: 6.4 10*3/uL (ref 4.0–10.5)

## 2022-10-24 LAB — TSH: TSH: 1.43 u[IU]/mL (ref 0.35–5.50)

## 2022-10-24 MED ORDER — AZITHROMYCIN 250 MG PO TABS: ORAL_TABLET | ORAL | 0 refills | Status: AC

## 2022-10-24 NOTE — Progress Notes (Signed)
   Established Patient Office Visit   Subjective:  Patient ID: Jackson Brown, male    DOB: Mar 13, 1956  Age: 67 y.o. MRN: [id number]  Chief Complaint  Patient presents with   Follow-up    6 month follow up discuss upcoming surgery. Little cold x 1 week.     HPI Encounter Diagnoses  Name Primary?   Bradycardia Yes   Lower respiratory infection    Need for immunization against influenza    Need for vaccination against Streptococcus pneumoniae    Prostate cancer ([place])    Follow-up of the 10-day history of nasal congestion postnasal drip cough.  Denies fevers chills difficulty breathing wheezing or tightness in the chest.  Cough is now productive of purulent longstanding history of bradycardia.  He exercises regularly.  No issues with chest pain shortness of breath diaphoresis or nausea.  Mildly elevated ASCVD risk score .  LDL cholesterol is 83 and HDL is 47.  Currently undergoing workup at [facility] for a laparoscopic prostatectomy with lymph node dissection on February 6th.    Review of Systems  Constitutional: Negative.  Negative for diaphoresis.  HENT: Negative.    Eyes:  Negative for blurred vision, discharge and redness.  Respiratory:  Positive for cough and sputum production. Negative for shortness of breath and wheezing.   Cardiovascular: Negative.  Negative for chest pain.  Gastrointestinal:  Negative for abdominal pain, nausea and vomiting.  Genitourinary: Negative.   Musculoskeletal: Negative.  Negative for myalgias.  Skin:  Negative for rash.  Neurological:  Negative for tingling, loss of consciousness and weakness.  Endo/Heme/Allergies:  Negative for polydipsia.     Current Outpatient Medications:    azithromycin (ZITHROMAX) 250 MG tablet, Take 2 tablets on day 1, then 1 tablet daily on days 2 through 5, Disp: 6 tablet, Rfl: 0   Cholecalciferol (VITAMIN D) 125 MCG (5000 UT) CAPS, , Disp: , Rfl:    Multiple Vitamin (MULTIVITAMIN WITH MINERALS) TABS tablet, Take 1  tablet by mouth daily., Disp: , Rfl:    Objective:     BP 124/72 (BP Location: Right Arm, Patient Position: Sitting, Cuff Size: Large)   Pulse (!) 56   Temp 97.6 F (36.4 C) (Temporal)   Ht '6\' 3"'$  (1.905 m)   Wt 242 lb 12.8 oz (110.1 kg)   SpO2 98%   BMI 30.35 kg/m    Physical Exam   No results found for any visits on 10/24/22.    The 10-year ASCVD risk score (Arnett DK, et al., 2019) is: 12.3%    Assessment & Plan:   Bradycardia -     EKG 12-Lead -     Ambulatory referral to Cardiology -     TSH -     CBC  Lower respiratory infection -     Azithromycin; Take 2 tablets on day 1, then 1 tablet daily on days 2 through 5  Dispense: 6 tablet; Refill: 0  Need for immunization against influenza -     Flu vaccine HIGH DOSE PF  Need for vaccination against Streptococcus pneumoniae -     Pneumococcal conjugate vaccine 20-valent  Prostate cancer ([place])    Return in about 5 months (around 03/25/2023).  Today's EKG confirms sinus bradycardia but was otherwise normal.  Believe the bradycardia could be associated with regular exercise.  Cardiology referral for second opinion.  Believe that he should go ahead and have his prostatectomy.   Libby Maw, MD

## 2022-10-26 ENCOUNTER — Encounter: Payer: Self-pay | Admitting: Family Medicine

## 2022-10-27 ENCOUNTER — Encounter: Payer: Self-pay | Admitting: Family Medicine

## 2022-10-30 ENCOUNTER — Other Ambulatory Visit: Payer: Self-pay

## 2022-10-30 ENCOUNTER — Other Ambulatory Visit: Payer: Medicare Other

## 2022-10-30 DIAGNOSIS — D696 Thrombocytopenia, unspecified: Secondary | ICD-10-CM

## 2022-10-30 DIAGNOSIS — Z0000 Encounter for general adult medical examination without abnormal findings: Secondary | ICD-10-CM

## 2022-10-30 NOTE — Telephone Encounter (Signed)
Called patient have printed off results per patient he will stop by to pick up.

## 2022-11-17 NOTE — Telephone Encounter (Signed)
Per Santiago Glad at [place] lab. Blue Top tube is NO longer approved by the CDC as a viable specimen when drawing for a CBC the testing machines used do not support the results. This is at the [facility] lab as well as [facility] and [facility]. Santiago Glad stated she notified the patient of this new revelation when he was present at the [address] address.   They are able to report everything but a platelet count due to clotting. If there are any questions please reach out to Santiago Glad at [phone], she may be able to explain in greater detail.

## 2022-11-21 HISTORY — PX: PROSTATE SURGERY: SHX751

## 2023-02-12 ENCOUNTER — Ambulatory Visit (INDEPENDENT_AMBULATORY_CARE_PROVIDER_SITE_OTHER): Payer: Medicare Other

## 2023-02-12 VITALS — Ht 74.0 in | Wt 232.0 lb

## 2023-02-12 DIAGNOSIS — Z0000 Encounter for general adult medical examination without abnormal findings: Secondary | ICD-10-CM | POA: Diagnosis not present

## 2023-02-12 NOTE — Progress Notes (Signed)
I connected with  Stormy Card on 02/12/23 by a audio enabled telemedicine application and verified that I am speaking with the correct person using two identifiers.  Patient Location: Home  Provider Location: Office/Clinic  I discussed the limitations of evaluation and management by telemedicine. The patient expressed understanding and agreed to proceed.  Subjective:   Jackson Brown is a 67 y.o. male who presents for Medicare Annual/Subsequent preventive examination.  Patient Medicare AWV questionnaire was completed by the patient on 02/08/2023; I have confirmed that all information answered by patient is correct and no changes since this date.     Review of Systems     Cardiac Risk Factors include: advanced age (>47men, >65 women)     Objective:    Today's Vitals   02/12/23 0812  Weight: 232 lb (105.2 kg)  Height: 6\' 2"  (1.88 m)   Body mass index is 29.79 kg/m.     02/12/2023    8:16 AM 02/08/2022    8:21 AM 02/01/2021    8:21 AM 09/07/2015    5:21 PM  Advanced Directives  Does Patient Have a Medical Advance Directive? No No No No  Would patient like information on creating a medical advance directive?  No - Patient declined No - Patient declined     Current Medications (verified) Outpatient Encounter Medications as of 02/12/2023  Medication Sig   Cholecalciferol (VITAMIN D) 125 MCG (5000 UT) CAPS    Multiple Vitamin (MULTIVITAMIN WITH MINERALS) TABS tablet Take 1 tablet by mouth daily.   No facility-administered encounter medications on file as of 02/12/2023.    Allergies (verified) Patient has no known allergies.   History: Past Medical History:  Diagnosis Date   Skin cancer    Past Surgical History:  Procedure Laterality Date   PROSTATE SURGERY  11/21/2022   Family History  Problem Relation Age of Onset   Cancer Mother    Early death Mother    Diabetes Father    Hearing loss Father    Hypertension Father    Stroke Father    Hypertension  Brother    Diabetes Paternal Grandfather    Social History   Socioeconomic History   Marital status: Married    Spouse name: Not on file   Number of children: Not on file   Years of education: Not on file   Highest education level: Bachelor's degree (e.g., BA, AB, BS)  Occupational History   Not on file  Tobacco Use   Smoking status: Never   Smokeless tobacco: Never  Vaping Use   Vaping Use: Never used  Substance and Sexual Activity   Alcohol use: Yes    Comment: occasional   Drug use: No   Sexual activity: Yes    Partners: Female  Other Topics Concern   Not on file  Social History Narrative   Not on file   Social Determinants of Health   Financial Resource Strain: Low Risk  (02/08/2023)   Overall Financial Resource Strain (CARDIA)    Difficulty of Paying Living Expenses: Not hard at all  Food Insecurity: No Food Insecurity (02/08/2023)   Hunger Vital Sign    Worried About Running Out of Food in the Last Year: Never true    Ran Out of Food in the Last Year: Never true  Transportation Needs: No Transportation Needs (02/08/2023)   PRAPARE - Administrator, Civil Service (Medical): No    Lack of Transportation (Non-Medical): No  Physical Activity: Insufficiently Active (02/08/2023)  Exercise Vital Sign    Days of Exercise per Week: 3 days    Minutes of Exercise per Session: 40 min  Stress: No Stress Concern Present (02/08/2023)   [employer] of Occupational Health - Occupational Stress Questionnaire    Feeling of Stress : Only a little  Social Connections: Unknown (02/08/2023)   Social Connection and Isolation Panel [NHANES]    Frequency of Communication with Friends and Family: Not on file    Frequency of Social Gatherings with Friends and Family: Not on file    Attends Religious Services: Not on Insurance claims handler of Clubs or Organizations: No    Attends Banker Meetings: Never    Marital Status: Married    Tobacco  Counseling Counseling given: Not Answered   Clinical Intake:  Pre-visit preparation completed: Yes  Pain : No/denies pain     Nutritional Status: BMI 25 -29 Overweight Nutritional Risks: None Diabetes: No  How often do you need to have someone help you when you read instructions, pamphlets, or other written materials from your doctor or pharmacy?: 1 - Never  Diabetic? no  Interpreter Needed?: No  Information entered by :: NAllen LPN   Activities of Daily Living    02/08/2023    4:00 PM  In your present state of health, do you have any difficulty performing the following activities:  Hearing? 1  Comment wears hearing aids  Vision? 0  Difficulty concentrating or making decisions? 0  Walking or climbing stairs? 0  Dressing or bathing? 0  Doing errands, shopping? 0  Preparing Food and eating ? N  Using the Toilet? N  In the past six months, have you accidently leaked urine? Y  Comment had prostate surgery  Do you have problems with loss of bowel control? N  Managing your Medications? N  Managing your Finances? N  Housekeeping or managing your Housekeeping? N    Patient Care Team: Mliss Sax, MD as PCP - General (Family Medicine)  Indicate any recent Medical Services you may have received from other than [facility] providers in the past year (date may be approximate).     Assessment:   This is a routine wellness examination for [place].  Hearing/Vision screen Vision Screening - Comments:: Regular eye exams, [facility]  Dietary issues and exercise activities discussed: Current Exercise Habits: Home exercise routine, Type of exercise: walking, Time (Minutes): 40, Frequency (Times/Week): 3, Weekly Exercise (Minutes/Week): 120   Goals Addressed             This Visit's Progress    Patient Stated       02/12/2023, no goals       Depression Screen    02/12/2023    8:17 AM 10/24/2022   10:13 AM 04/11/2022    9:19 AM 02/08/2022    8:22 AM  02/08/2022    8:20 AM 10/11/2021    2:01 PM 07/07/2021    2:36 PM  PHQ 2/9 Scores  PHQ - 2 Score 0 0 0 0 0 0 0    Fall Risk    02/08/2023    4:00 PM 10/24/2022   10:13 AM 04/11/2022    9:19 AM 02/08/2022    8:22 AM 10/11/2021    2:01 PM  Fall Risk   Falls in the past year? 0 0 0 0 0  Number falls in past yr: 0 0 0 0 0  Injury with Fall? 0 0  0   Risk for fall  due to : No Fall Risks No Fall Risks     Follow up Falls prevention discussed;Education provided;Falls evaluation completed Falls evaluation completed  Falls evaluation completed     FALL RISK PREVENTION PERTAINING TO THE HOME:  Any stairs in or around the home? Yes  If so, are there any without handrails? No  Home free of loose throw rugs in walkways, pet beds, electrical cords, etc? Yes  Adequate lighting in your home to reduce risk of falls? Yes   ASSISTIVE DEVICES UTILIZED TO PREVENT FALLS:  Life alert? No  Use of a cane, walker or w/c? No  Grab bars in the bathroom? No  Shower chair or bench in shower? Yes  Elevated toilet seat or a handicapped toilet? Yes   TIMED UP AND GO:  Was the test performed? No .       Cognitive Function:        02/12/2023    8:18 AM  6CIT Screen  What Year? 0 points  What month? 0 points  What time? 0 points  Count back from 20 0 points  Months in reverse 0 points  Repeat phrase 2 points  Total Score 2 points    Immunizations Immunization History  Administered Date(s) Administered   Influenza Inj Mdck Quad Pf 08/01/2018   Influenza, High Dose Seasonal PF 07/22/2021, 10/24/2022   Influenza,inj,Quad PF,6+ Mos 09/01/2019   Influenza-Unspecified 08/15/2017, 07/21/2020   Moderna Sars-Covid-2 Vaccination 07/25/2020, 08/20/2020, 02/22/2021   PNEUMOCOCCAL CONJUGATE-20 10/24/2022    TDAP status: Up to date  Flu Vaccine status: Up to date  Pneumococcal vaccine status: Up to date  Covid-19 vaccine status: Completed vaccines  Qualifies for Shingles Vaccine? Yes    Zostavax completed No   Shingrix Completed?: No.    Education has been provided regarding the importance of this vaccine. Patient has been advised to call insurance company to determine out of pocket expense if they have not yet received this vaccine. Advised may also receive vaccine at local pharmacy or Health Dept. Verbalized acceptance and understanding.  Screening Tests Health Maintenance  Topic Date Due   Medicare Annual Wellness (AWV)  02/09/2023   COVID-19 Vaccine (4 - 2023-24 season) 11/10/2023 (Originally 06/16/2022)   Zoster Vaccines- Shingrix (1 of 2) 01/23/2024 (Originally [birth year]/1973)   INFLUENZA VACCINE  05/17/2023   COLONOSCOPY (Pts 45-61yrs Insurance coverage will need to be confirmed)  06/17/2023   Pneumonia Vaccine 75+ Years old  Completed   Hepatitis C Screening  Completed   HPV VACCINES  Aged Out   DTaP/Tdap/Td  Discontinued    Health Maintenance  Health Maintenance Due  Topic Date Due   Medicare Annual Wellness (AWV)  02/09/2023    Colorectal cancer screening: Type of screening: Colonoscopy. Completed 06/16/2013. Repeat every 10 years  Lung Cancer Screening: (Low Dose CT Chest recommended if Age 55-80 years, 30 pack-year currently smoking OR have quit w/in 15years.) does not qualify.   Lung Cancer Screening Referral: no  Additional Screening:  Hepatitis C Screening: does qualify; Completed 09/27/2017  Vision Screening: Recommended annual ophthalmology exams for early detection of glaucoma and other disorders of the eye. Is the patient up to date with their annual eye exam?  Yes  Who is the provider or what is the name of the office in which the patient attends annual eye exams? [facility] If pt is not established with a provider, would they like to be referred to a provider to establish care? No .   Dental Screening: Recommended annual  dental exams for proper oral hygiene  Community Resource Referral / Chronic Care Management: CRR required this visit?  No    CCM required this visit?  No      Plan:     I have personally reviewed and noted the following in the patient's chart:   Medical and social history Use of alcohol, tobacco or illicit drugs  Current medications and supplements including opioid prescriptions. Patient is not currently taking opioid prescriptions. Functional ability and status Nutritional status Physical activity Advanced directives List of other physicians Hospitalizations, surgeries, and ER visits in previous 12 months Vitals Screenings to include cognitive, depression, and falls Referrals and appointments  In addition, I have reviewed and discussed with patient certain preventive protocols, quality metrics, and best practice recommendations. A written personalized care plan for preventive services as well as general preventive health recommendations were provided to patient.     Barb Merino, LPN   06/29/7828   Nurse Notes: none  Due to this being a virtual visit, the after visit summary with patients personalized plan was offered to patient via mail or my-chart.  Patient would like to access on my-chart

## 2023-02-12 NOTE — Patient Instructions (Signed)
Jackson Brown , Thank you for taking time to come for your Medicare Wellness Visit. I appreciate your ongoing commitment to your health goals. Please review the following plan we discussed and let me know if I can assist you in the future.   These are the goals we discussed:  Goals      Patient Stated     Maintain current healthy active lifestyle     Patient Stated     02/12/2023, no goals        This is a list of the screening recommended for you and due dates:  Health Maintenance  Topic Date Due   COVID-19 Vaccine (4 - 2023-24 season) 11/10/2023*   Zoster (Shingles) Vaccine (1 of 2) 01/23/2024*   Flu Shot  05/17/2023   Colon Cancer Screening  06/17/2023   Medicare Annual Wellness Visit  02/12/2024   Pneumonia Vaccine  Completed   Hepatitis C Screening: USPSTF Recommendation to screen - Ages 18-79 yo.  Completed   HPV Vaccine  Aged Out   DTaP/Tdap/Td vaccine  Discontinued  *Topic was postponed. The date shown is not the original due date.    Advanced directives: Advance directive discussed with you today.   Conditions/risks identified: none  Next appointment: Follow up in one year for your annual wellness visit.   Preventive Care 8 Years and Older, Male  Preventive care refers to lifestyle choices and visits with your health care provider that can promote health and wellness. What does preventive care include? A yearly physical exam. This is also called an annual well check. Dental exams once or twice a year. Routine eye exams. Ask your health care provider how often you should have your eyes checked. Personal lifestyle choices, including: Daily care of your teeth and gums. Regular physical activity. Eating a healthy diet. Avoiding tobacco and drug use. Limiting alcohol use. Practicing safe sex. Taking low doses of aspirin every day. Taking vitamin and mineral supplements as recommended by your health care provider. What happens during an annual well check? The  services and screenings done by your health care provider during your annual well check will depend on your age, overall health, lifestyle risk factors, and family history of disease. Counseling  Your health care provider may ask you questions about your: Alcohol use. Tobacco use. Drug use. Emotional well-being. Home and relationship well-being. Sexual activity. Eating habits. History of falls. Memory and ability to understand (cognition). Work and work Astronomer. Screening  You may have the following tests or measurements: Height, weight, and BMI. Blood pressure. Lipid and cholesterol levels. These may be checked every 5 years, or more frequently if you are over 80 years old. Skin check. Lung cancer screening. You may have this screening every year starting at age 19 if you have a 30-pack-year history of smoking and currently smoke or have quit within the past 15 years. Fecal occult blood test (FOBT) of the stool. You may have this test every year starting at age 53. Flexible sigmoidoscopy or colonoscopy. You may have a sigmoidoscopy every 5 years or a colonoscopy every 10 years starting at age 57. Prostate cancer screening. Recommendations will vary depending on your family history and other risks. Hepatitis C blood test. Hepatitis B blood test. Sexually transmitted disease (STD) testing. Diabetes screening. This is done by checking your blood sugar (glucose) after you have not eaten for a while (fasting). You may have this done every 1-3 years. Abdominal aortic aneurysm (AAA) screening. You may need this if you are  a current or former smoker. Osteoporosis. You may be screened starting at age 1 if you are at high risk. Talk with your health care provider about your test results, treatment options, and if necessary, the need for more tests. Vaccines  Your health care provider may recommend certain vaccines, such as: Influenza vaccine. This is recommended every year. Tetanus,  diphtheria, and acellular pertussis (Tdap, Td) vaccine. You may need a Td booster every 10 years. Zoster vaccine. You may need this after age 65. Pneumococcal 13-valent conjugate (PCV13) vaccine. One dose is recommended after age 53. Pneumococcal polysaccharide (PPSV23) vaccine. One dose is recommended after age 50. Talk to your health care provider about which screenings and vaccines you need and how often you need them. This information is not intended to replace advice given to you by your health care provider. Make sure you discuss any questions you have with your health care provider. Document Released: 10/29/2015 Document Revised: 06/21/2016 Document Reviewed: 08/03/2015 Elsevier Interactive Patient Education  2017 [employer].  Fall Prevention in the Home Falls can cause injuries. They can happen to people of all ages. There are many things you can do to make your home safe and to help prevent falls. What can I do on the outside of my home? Regularly fix the edges of walkways and driveways and fix any cracks. Remove anything that might make you trip as you walk through a door, such as a raised step or threshold. Trim any bushes or trees on the path to your home. Use bright outdoor lighting. Clear any walking paths of anything that might make someone trip, such as rocks or tools. Regularly check to see if handrails are loose or broken. Make sure that both sides of any steps have handrails. Any raised decks and porches should have guardrails on the edges. Have any leaves, snow, or ice cleared regularly. Use sand or salt on walking paths during winter. Clean up any spills in your garage right away. This includes oil or grease spills. What can I do in the bathroom? Use night lights. Install grab bars by the toilet and in the tub and shower. Do not use towel bars as grab bars. Use non-skid mats or decals in the tub or shower. If you need to sit down in the shower, use a plastic,  non-slip stool. Keep the floor dry. Clean up any water that spills on the floor as soon as it happens. Remove soap buildup in the tub or shower regularly. Attach bath mats securely with double-sided non-slip rug tape. Do not have throw rugs and other things on the floor that can make you trip. What can I do in the bedroom? Use night lights. Make sure that you have a light by your bed that is easy to reach. Do not use any sheets or blankets that are too big for your bed. They should not hang down onto the floor. Have a firm chair that has side arms. You can use this for support while you get dressed. Do not have throw rugs and other things on the floor that can make you trip. What can I do in the kitchen? Clean up any spills right away. Avoid walking on wet floors. Keep items that you use a lot in easy-to-reach places. If you need to reach something above you, use a strong step stool that has a grab bar. Keep electrical cords out of the way. Do not use floor polish or wax that makes floors slippery. If you must  use wax, use non-skid floor wax. Do not have throw rugs and other things on the floor that can make you trip. What can I do with my stairs? Do not leave any items on the stairs. Make sure that there are handrails on both sides of the stairs and use them. Fix handrails that are broken or loose. Make sure that handrails are as long as the stairways. Check any carpeting to make sure that it is firmly attached to the stairs. Fix any carpet that is loose or worn. Avoid having throw rugs at the top or bottom of the stairs. If you do have throw rugs, attach them to the floor with carpet tape. Make sure that you have a light switch at the top of the stairs and the bottom of the stairs. If you do not have them, ask someone to add them for you. What else can I do to help prevent falls? Wear shoes that: Do not have high heels. Have rubber bottoms. Are comfortable and fit you well. Are closed  at the toe. Do not wear sandals. If you use a stepladder: Make sure that it is fully opened. Do not climb a closed stepladder. Make sure that both sides of the stepladder are locked into place. Ask someone to hold it for you, if possible. Clearly mark and make sure that you can see: Any grab bars or handrails. First and last steps. Where the edge of each step is. Use tools that help you move around (mobility aids) if they are needed. These include: Canes. Walkers. Scooters. Crutches. Turn on the lights when you go into a dark area. Replace any light bulbs as soon as they burn out. Set up your furniture so you have a clear path. Avoid moving your furniture around. If any of your floors are uneven, fix them. If there are any pets around you, be aware of where they are. Review your medicines with your doctor. Some medicines can make you feel dizzy. This can increase your chance of falling. Ask your doctor what other things that you can do to help prevent falls. This information is not intended to replace advice given to you by your health care provider. Make sure you discuss any questions you have with your health care provider. Document Released: 07/29/2009 Document Revised: 03/09/2016 Document Reviewed: 11/06/2014 Elsevier Interactive Patient Education  2017 [employer].

## 2023-03-26 ENCOUNTER — Ambulatory Visit (INDEPENDENT_AMBULATORY_CARE_PROVIDER_SITE_OTHER): Payer: Medicare Other | Admitting: Family Medicine

## 2023-03-26 ENCOUNTER — Encounter: Payer: Self-pay | Admitting: Family Medicine

## 2023-03-26 VITALS — BP 134/70 | HR 53 | Temp 98.0°F | Ht 74.0 in | Wt 240.0 lb

## 2023-03-26 DIAGNOSIS — D649 Anemia, unspecified: Secondary | ICD-10-CM | POA: Diagnosis not present

## 2023-03-26 DIAGNOSIS — Z0000 Encounter for general adult medical examination without abnormal findings: Secondary | ICD-10-CM

## 2023-03-26 DIAGNOSIS — E559 Vitamin D deficiency, unspecified: Secondary | ICD-10-CM | POA: Diagnosis not present

## 2023-03-26 DIAGNOSIS — Z1211 Encounter for screening for malignant neoplasm of colon: Secondary | ICD-10-CM

## 2023-03-26 NOTE — Progress Notes (Signed)
Established Patient Office Visit   Subjective:  Patient ID: Jackson Brown, male    DOB: Jul 23, 1956  Age: 67 y.o. MRN: [id number]  Chief Complaint  Patient presents with   Medical Management of Chronic Issues    Routine follow up on labs, no concerns. Patient not fasting.     HPI Encounter Diagnoses  Name Primary?   Healthcare maintenance Yes   Screening for colon cancer    Normocytic anemia    Vitamin D deficiency    For health check today.  Doing well status post prostatectomy back in February.  Continues with Kegel exercises.  Has almost regained continence.  Continues to exercise daily by walking.  Continues to work.  Contemplating retirement.  PSA through [facility] was .01 last month.  He continues follow-up there.  Last colonoscopy 2024.   Review of Systems  Constitutional: Negative.   HENT: Negative.    Eyes:  Negative for blurred vision, discharge and redness.  Respiratory: Negative.    Cardiovascular: Negative.   Gastrointestinal:  Negative for abdominal pain, blood in stool and constipation.  Genitourinary: Negative.  Negative for dysuria, frequency and urgency.  Musculoskeletal: Negative.  Negative for myalgias.  Skin:  Negative for rash.  Neurological:  Negative for tingling, loss of consciousness and weakness.  Endo/Heme/Allergies:  Negative for polydipsia.     Current Outpatient Medications:    Cholecalciferol (VITAMIN D) 125 MCG (5000 UT) CAPS, , Disp: , Rfl:    Multiple Vitamin (MULTIVITAMIN WITH MINERALS) TABS tablet, Take 1 tablet by mouth daily., Disp: , Rfl:    Objective:     BP 134/70 (BP Location: Right Arm, Patient Position: Sitting, Cuff Size: Large)   Pulse (!) 53   Temp 98 F (36.7 C) (Temporal)   Ht 6\' 2"  (1.88 m)   Wt 240 lb (108.9 kg)   SpO2 95%   BMI 30.81 kg/m    Physical Exam Constitutional:      General: He is not in acute distress.    Appearance: Normal appearance. He is not ill-appearing, toxic-appearing or diaphoretic.   HENT:     Head: Normocephalic and atraumatic.     Right Ear: External ear normal.     Left Ear: External ear normal.     Mouth/Throat:     Mouth: Mucous membranes are moist.     Pharynx: Oropharynx is clear. No oropharyngeal exudate or posterior oropharyngeal erythema.  Eyes:     General: No scleral icterus.       Right eye: No discharge.        Left eye: No discharge.     Extraocular Movements: Extraocular movements intact.     Conjunctiva/sclera: Conjunctivae normal.     Pupils: Pupils are equal, round, and reactive to light.  Cardiovascular:     Rate and Rhythm: Normal rate and regular rhythm.  Pulmonary:     Effort: Pulmonary effort is normal. No respiratory distress.     Breath sounds: Normal breath sounds.  Abdominal:     General: Bowel sounds are normal.     Tenderness: There is no abdominal tenderness. There is no guarding or rebound.  Musculoskeletal:     Cervical back: No rigidity or tenderness.  Skin:    General: Skin is warm and dry.  Neurological:     Mental Status: He is alert and oriented to person, place, and time.  Psychiatric:        Mood and Affect: Mood normal.        Behavior:  Behavior normal.      No results found for any visits on 03/26/23.    The 10-year ASCVD risk score (Arnett DK, et al., 2019) is: 15.1%    Assessment & Plan:   Healthcare maintenance -     Comprehensive metabolic panel -     Lipid panel -     Urinalysis, Routine w reflex microscopic  Screening for colon cancer -     Ambulatory referral to Gastroenterology  Normocytic anemia -     CBC -     B12 and Folate Panel -     Iron, TIBC and Ferritin Panel  Vitamin D deficiency -     VITAMIN D 25 Hydroxy (Vit-D Deficiency, Fractures)    Return in about 1 year (around 03/25/2024), or if symptoms worsen or fail to improve.    Mliss Sax, MD

## 2023-03-28 ENCOUNTER — Ambulatory Visit: Payer: Medicare Other | Admitting: Cardiology

## 2023-04-04 ENCOUNTER — Other Ambulatory Visit (INDEPENDENT_AMBULATORY_CARE_PROVIDER_SITE_OTHER): Payer: Medicare Other

## 2023-04-04 ENCOUNTER — Telehealth: Payer: Self-pay

## 2023-04-04 DIAGNOSIS — Z0000 Encounter for general adult medical examination without abnormal findings: Secondary | ICD-10-CM | POA: Diagnosis not present

## 2023-04-04 DIAGNOSIS — E559 Vitamin D deficiency, unspecified: Secondary | ICD-10-CM | POA: Diagnosis not present

## 2023-04-04 DIAGNOSIS — D649 Anemia, unspecified: Secondary | ICD-10-CM

## 2023-04-04 LAB — COMPREHENSIVE METABOLIC PANEL
ALT: 29 U/L (ref 0–53)
AST: 22 U/L (ref 0–37)
Albumin: 4.2 g/dL (ref 3.5–5.2)
Alkaline Phosphatase: 49 U/L (ref 39–117)
BUN: 18 mg/dL (ref 6–23)
CO2: 28 mEq/L (ref 19–32)
Calcium: 9 mg/dL (ref 8.4–10.5)
Chloride: 105 mEq/L (ref 96–112)
Creatinine, Ser: 1.03 mg/dL (ref 0.40–1.50)
GFR: 74.72 mL/min (ref >60.00)
Glucose, Bld: 103 mg/dL — ABNORMAL HIGH (ref 70–99)
Potassium: 4.7 mEq/L (ref 3.5–5.1)
Sodium: 140 mEq/L (ref 135–145)
Total Bilirubin: 0.4 mg/dL (ref 0.2–1.2)
Total Protein: 6.7 g/dL (ref 6.0–8.3)

## 2023-04-04 LAB — URINALYSIS, ROUTINE W REFLEX MICROSCOPIC
Bilirubin Urine: NEGATIVE
Hgb urine dipstick: NEGATIVE
Ketones, ur: NEGATIVE
Leukocytes,Ua: NEGATIVE
Nitrite: NEGATIVE
Specific Gravity, Urine: 1.02 (ref 1.000–1.030)
Total Protein, Urine: NEGATIVE
Urine Glucose: NEGATIVE
Urobilinogen, UA: 0.2 (ref 0.0–1.0)
pH: 6.5 (ref 5.0–8.0)

## 2023-04-04 LAB — LIPID PANEL
Cholesterol: 160 mg/dL (ref 0–200)
HDL: 34.8 mg/dL — ABNORMAL LOW (ref >39.00)
LDL Cholesterol: 100 mg/dL — ABNORMAL HIGH (ref 0–99)
NonHDL: 125.02
Total CHOL/HDL Ratio: 5
Triglycerides: 127 mg/dL (ref 0.0–149.0)
VLDL: 25.4 mg/dL (ref 0.0–40.0)

## 2023-04-04 LAB — IBC + FERRITIN
Ferritin: 111 ng/mL (ref 22.0–322.0)
Iron: 74 ug/dL (ref 42–165)
Saturation Ratios: 22.5 % (ref 20.0–50.0)
TIBC: 329 ug/dL (ref 250.0–450.0)
Transferrin: 235 mg/dL (ref 212.0–360.0)

## 2023-04-04 LAB — VITAMIN B12: Vitamin B-12: 599 pg/mL (ref 211–911)

## 2023-04-04 LAB — VITAMIN D 25 HYDROXY (VIT D DEFICIENCY, FRACTURES): VITD: 57.51 ng/mL (ref 30.00–100.00)

## 2023-04-04 LAB — FOLATE: Folate: >23.8 ng/mL (ref >5.9)

## 2023-04-04 NOTE — Telephone Encounter (Signed)
Spoke with Clydie Braun at main lab regarding patients ordered lab (CBC) per Clydie Braun they thought they would be able to run this at their lab but they are not. This attempt resulted out false results and could not result out patients platelet levels. Clydie Braun states that she discussed this with patient and his wife and informed them that the closes place that would be able to run patient CBC would be at [facility].

## 2023-04-05 ENCOUNTER — Encounter: Payer: Self-pay | Admitting: Family Medicine

## 2023-04-06 NOTE — Telephone Encounter (Signed)
Patient states that he has an appointment scheduled with [facility] in August and he will wait for this appointment to have CBC checked.

## 2023-09-24 ENCOUNTER — Ambulatory Visit (INDEPENDENT_AMBULATORY_CARE_PROVIDER_SITE_OTHER): Payer: Medicare Other | Admitting: Family Medicine

## 2023-09-24 ENCOUNTER — Encounter: Payer: Self-pay | Admitting: Family Medicine

## 2023-09-24 VITALS — BP 118/82 | HR 45 | Temp 97.4°F | Ht 74.0 in | Wt 234.0 lb

## 2023-09-24 DIAGNOSIS — H60501 Unspecified acute noninfective otitis externa, right ear: Secondary | ICD-10-CM | POA: Diagnosis not present

## 2023-09-24 DIAGNOSIS — H6123 Impacted cerumen, bilateral: Secondary | ICD-10-CM | POA: Diagnosis not present

## 2023-09-24 MED ORDER — NEOMYCIN-POLYMYXIN-HC 3.5-10000-1 OT SOLN
4.0000 [drp] | Freq: Three times a day (TID) | OTIC | 0 refills | Status: AC
Start: 2023-09-24 — End: 2023-10-01

## 2023-09-24 NOTE — Progress Notes (Signed)
Established Patient Office Visit   Subjective:  Patient ID: Jackson Brown, male    DOB: [birth year]  Age: 67 y.o. MRN: [id number]  Chief Complaint  Patient presents with   Cerumen Impaction    Ear wax removal.    HPI Encounter Diagnoses  Name Primary?   Bilateral impacted cerumen Yes   Acute otitis externa of right ear, unspecified type    Was receiving feedback through hearing aids and audiologist said that his ears were impacted with cerumen.   Review of Systems  Constitutional: Negative.   HENT:  Positive for hearing loss.   Eyes:  Negative for blurred vision, discharge and redness.  Respiratory: Negative.    Cardiovascular: Negative.   Gastrointestinal:  Negative for abdominal pain.  Genitourinary: Negative.   Musculoskeletal: Negative.  Negative for myalgias.  Skin:  Negative for rash.  Neurological:  Negative for tingling, loss of consciousness and weakness.  Endo/Heme/Allergies:  Negative for polydipsia.     Current Outpatient Medications:    Cholecalciferol (VITAMIN D) 125 MCG (5000 UT) CAPS, , Disp: , Rfl:    Multiple Vitamin (MULTIVITAMIN WITH MINERALS) TABS tablet, Take 1 tablet by mouth daily., Disp: , Rfl:    neomycin-polymyxin-hydrocortisone (CORTISPORIN) OTIC solution, Place 4 drops into the right ear 3 (three) times daily for 7 days., Disp: 10 mL, Rfl: 0   Objective:     BP 118/82   Pulse (!) 45   Temp (!) 97.4 F (36.3 C)   Ht 6\' 2"  (1.88 m)   Wt 234 lb (106.1 kg)   SpO2 98%   BMI 30.04 kg/m    Physical Exam Constitutional:      General: He is not in acute distress.    Appearance: Normal appearance. He is not ill-appearing, toxic-appearing or diaphoretic.  HENT:     Head: Normocephalic and atraumatic.     Right Ear: External ear normal. There is impacted cerumen.     Left Ear: External ear normal. There is impacted cerumen.  Eyes:     General: No scleral icterus.       Right eye: No discharge.        Left eye: No discharge.      Extraocular Movements: Extraocular movements intact.     Conjunctiva/sclera: Conjunctivae normal.  Pulmonary:     Effort: Pulmonary effort is normal. No respiratory distress.  Skin:    General: Skin is warm and dry.  Neurological:     Mental Status: He is alert and oriented to person, place, and time.  Psychiatric:        Mood and Affect: Mood normal.        Behavior: Behavior normal.    Subjective:    Jackson Brown is a 67 y.o. male whom I am asked to see for evaluation of diminished hearing in both ears for the past 3 months. There is a prior history of cerumen impaction. The patient has not been using ear drops to loosen wax immediately prior to this visit. The patient denies ear pain.  The patient's history has been marked as reviewed and updated as appropriate.  Review of Systems Pertinent items are noted in HPI.    Objective:    Auditory canal(s) of both ears are completely obstructed with cerumen.   Cerumen was removed using gentle irrigation, soft and hard curettes. Tympanic membranes are intact following the procedure.  Auditory canals are inflamed.    Assessment:    Cerumen Impaction with otitis externa.    Plan:  1. Care instructions given. 2. Home treatment: Cortisporin. 3. Follow-up as needed.     No results found for any visits on 09/24/23.    The 10-year ASCVD risk score (Arnett DK, et al., 2019) is: 14.5%    Assessment & Plan:   Bilateral impacted cerumen  Acute otitis externa of right ear, unspecified type -     Neomycin-Polymyxin-HC; Place 4 drops into the right ear 3 (three) times daily for 7 days.  Dispense: 10 mL; Refill: 0    Return in about 1 week (around 10/01/2023), or if symptoms worsen or fail to improve.    Mliss Sax, MD

## 2023-10-04 ENCOUNTER — Ambulatory Visit: Payer: Medicare Other | Admitting: Family Medicine

## 2023-10-04 VITALS — BP 128/74 | HR 86 | Temp 97.8°F | Wt 229.4 lb

## 2023-10-04 DIAGNOSIS — H6123 Impacted cerumen, bilateral: Secondary | ICD-10-CM

## 2023-10-04 NOTE — Progress Notes (Signed)
Established Patient Office Visit   Subjective:  Patient ID: Jackson Brown, male    DOB: 1956/09/04  Age: 67 y.o. MRN: [id number]  Chief Complaint  Patient presents with   Medical Management of Chronic Issues    Follow up right and left ear. States that it has gotten better. Would like to discuss stress test    HPI Encounter Diagnoses  Name Primary?   Bilateral impacted cerumen Yes   Ears are doing better after irrigation there is no longer feedback through his hearing aids.  There is no pain.  His hearing is improved.  He has been using softening drops.  Was seen at [facility] cardiology and given a stress treadmill test.  Blood pressure recovery was slow and the cardiologist felt like he should be treated for hypertension.  He was started on 25 mg of losartan.   Review of Systems  Constitutional: Negative.   HENT: Negative.    Eyes:  Negative for blurred vision, discharge and redness.  Respiratory: Negative.    Cardiovascular: Negative.   Gastrointestinal:  Negative for abdominal pain.  Genitourinary: Negative.   Musculoskeletal: Negative.  Negative for myalgias.  Skin:  Negative for rash.  Neurological:  Negative for tingling, loss of consciousness and weakness.  Endo/Heme/Allergies:  Negative for polydipsia.     Current Outpatient Medications:    Cholecalciferol (VITAMIN D) 125 MCG (5000 UT) CAPS, , Disp: , Rfl:    Multiple Vitamin (MULTIVITAMIN WITH MINERALS) TABS tablet, Take 1 tablet by mouth daily., Disp: , Rfl:    losartan (COZAAR) 25 MG tablet, Take 1 tablet by mouth daily. (Patient not taking: Reported on 10/04/2023), Disp: , Rfl:    Objective:     BP 128/74   Pulse 86   Temp 97.8 F (36.6 C) (Temporal)   Wt 229 lb 6.4 oz (104.1 kg)   SpO2 97%   BMI 29.45 kg/m  BP Readings from Last 3 Encounters:  10/04/23 128/74  09/24/23 118/82  03/26/23 134/70   Wt Readings from Last 3 Encounters:  10/04/23 229 lb 6.4 oz (104.1 kg)  09/24/23 234 lb (106.1 kg)   03/26/23 240 lb (108.9 kg)      Physical Exam Constitutional:      General: He is not in acute distress.    Appearance: Normal appearance. He is not ill-appearing, toxic-appearing or diaphoretic.  HENT:     Head: Normocephalic and atraumatic.     Right Ear: External ear normal.     Left Ear: External ear normal.     Ears:     Comments: A small amount of nonobstructing cerumen persists in the left ear.  Significant amount of cerumen persists in the right ear.  Does not appear to be obstructing. Eyes:     General: No scleral icterus.       Right eye: No discharge.        Left eye: No discharge.     Extraocular Movements: Extraocular movements intact.     Conjunctiva/sclera: Conjunctivae normal.  Pulmonary:     Effort: Pulmonary effort is normal. No respiratory distress.  Skin:    General: Skin is warm and dry.  Neurological:     Mental Status: He is alert and oriented to person, place, and time.  Psychiatric:        Mood and Affect: Mood normal.        Behavior: Behavior normal.      No results found for any visits on 10/04/23.    The 10-year  ASCVD risk score (Arnett DK, et al., 2019) is: 16.6%    Assessment & Plan:   Bilateral impacted cerumen -     Ambulatory referral to ENT    Return Should have follow-up appointment scheduled in June..  Will start the losartan and follow-up with [facility] cardiology as directed.  Discussed lightheadedness is being a sign of low blood pressure.  His wife who is a retired [employer] fundraiser will continue to monitor his blood pressures.  Mliss Sax, MD

## 2023-10-15 ENCOUNTER — Ambulatory Visit (INDEPENDENT_AMBULATORY_CARE_PROVIDER_SITE_OTHER): Payer: Medicare Other | Admitting: Physician Assistant

## 2023-10-15 ENCOUNTER — Encounter (INDEPENDENT_AMBULATORY_CARE_PROVIDER_SITE_OTHER): Payer: Self-pay | Admitting: Physician Assistant

## 2023-10-15 DIAGNOSIS — H6123 Impacted cerumen, bilateral: Secondary | ICD-10-CM

## 2023-10-15 NOTE — Progress Notes (Signed)
Dear Dr. Doreene Burke, Here is my assessment for our mutual patient, Jackson Brown. Thank you for allowing me the opportunity to care for your patient. Please do not hesitate to contact me should you have any other questions. Sincerely, Burna Forts PA-C  Otolaryngology Clinic Note Referring provider: Dr. Doreene Burke HPI:  Jackson Brown is a 67 y.o. male kindly referred by Dr. Doreene Burke   The patient presents today with cerumen impaction.  The patient notes that he has a history of hearing loss.  He notes a longstanding history of bilateral symmetric hearing loss, he started using hearing aids approximately 8 years ago.  He notes his last audiogram was approximately 3 to 4 years ago [facility] [address].  He notes things have been going well, he is happy with his hearing aids as they help him significantly.  He was supposed to have an audiogram that too much cerumen, primary care provider who attempted irrigation of the cerumen, this was unsuccessful.  He was started on otic drops and referred to.  Just to his hearing, he denies any signs of infection, no other concerns.   Independent Review of Additional Tests or Records:  none   PMH/Meds/All/SocHx/FamHx/ROS:   Past Medical History:  Diagnosis Date   Skin cancer      Past Surgical History:  Procedure Laterality Date   PROSTATE SURGERY  11/21/2022    Family History  Problem Relation Age of Onset   Cancer Mother    Early death Mother    Diabetes Father    Hearing loss Father    Hypertension Father    Stroke Father    Hypertension Brother    Diabetes Paternal Grandfather      Social Connections: Unknown (10/04/2023)   Social Connection and Isolation Panel [NHANES]    Frequency of Communication with Friends and Family: Patient declined    Frequency of Social Gatherings with Friends and Family: Patient declined    Attends Religious Services: Patient declined    Database administrator or Organizations: Patient declined     Attends Banker Meetings: Never    Marital Status: Married      Current Outpatient Medications:    Cholecalciferol (VITAMIN D) 125 MCG (5000 UT) CAPS, , Disp: , Rfl:    losartan (COZAAR) 25 MG tablet, Take 1 tablet by mouth daily. (Patient not taking: Reported on 10/04/2023), Disp: , Rfl:    Multiple Vitamin (MULTIVITAMIN WITH MINERALS) TABS tablet, Take 1 tablet by mouth daily., Disp: , Rfl:    Physical Exam:   There were no vitals taken for this visit.  Pertinent Findings  CN II-XII intact Bilateral cerumen impaction-status post impaction bilateral TMs intact with well pneumatized middle ear Anterior rhinoscopy: Septum midline No lesions of oral cavity/oropharynx; dentition wnl No obviously palpable neck masses/lymphadenopathy/thyromegaly No respiratory distress or stridor  Seprately Identifiable Procedures:  Procedure: Bilateral ear microscopy and cerumen removal using microscope (CPT 69210) - Mod 50 Pre-procedure diagnosis:Cerumen impaction bilateral external ears Post-procedure diagnosis: same Indication: cerumen impaction; given patient's otologic complaints and history as well as for improved and comprehensive examination of external ear and tympanic membrane, bilateral otologic examination using microscope was performed and impacted cerumen removed  Procedure: Patient was placed semi-recumbent. Both ear canals were examined using the microscope with findings above. Cerumen removed on left and on right using suction and currette with improvement in EAC examination and patency. Left: EAC was patent. TM was intact . Middle ear was aerated. Drainage: none Right: EAC  was patent. TM was intact. Middle ear was aerated. Drainage: none Patient tolerated the procedure well.       Impression & Plans:  Jackson Brown is a 67 y.o. male with the following   Cerumen impaction-  I was able to successfully removed bilateral impactions. He tolerated this well with no  issues. Ok to follow up in 6 months for repeat exam. He will continue ongoing care for hearing, we are happy to see him for this at anytime. Return precautions given.    - f/u 6 months   Thank you for allowing me the opportunity to care for your patient. Please do not hesitate to contact me should you have any other questions.  Sincerely, Burna Forts PA-C  [facility] Phone: [phone] Fax: [phone]  10/15/2023, 10:13 AM

## 2023-12-20 ENCOUNTER — Encounter: Payer: Self-pay | Admitting: Family Medicine

## 2023-12-20 ENCOUNTER — Ambulatory Visit (INDEPENDENT_AMBULATORY_CARE_PROVIDER_SITE_OTHER): Admitting: Family Medicine

## 2023-12-20 VITALS — BP 140/76 | HR 45 | Temp 97.8°F | Resp 18 | Wt 238.2 lb

## 2023-12-20 DIAGNOSIS — I10 Essential (primary) hypertension: Secondary | ICD-10-CM

## 2023-12-20 DIAGNOSIS — S56911A Strain of unspecified muscles, fascia and tendons at forearm level, right arm, initial encounter: Secondary | ICD-10-CM | POA: Diagnosis not present

## 2023-12-20 LAB — BASIC METABOLIC PANEL
BUN: 20 mg/dL (ref 6–23)
CO2: 29 meq/L (ref 19–32)
Calcium: 9.3 mg/dL (ref 8.4–10.5)
Chloride: 102 meq/L (ref 96–112)
Creatinine, Ser: 1.06 mg/dL (ref 0.40–1.50)
GFR: 71.83 mL/min (ref >60.00)
Glucose, Bld: 95 mg/dL (ref 70–99)
Potassium: 4.1 meq/L (ref 3.5–5.1)
Sodium: 139 meq/L (ref 135–145)

## 2023-12-20 NOTE — Progress Notes (Signed)
 Established Patient Office Visit   Subjective:  Patient ID: Jackson Brown, male    DOB: Nov 22, 1955  Age: 68 y.o. MRN: [id number]  Chief Complaint  Patient presents with   joint pain    Pt C/O of joint pain on right wrist, forearm and elbow for 1 week. Pt used OTC tylenol, Motrin, compression sleeves, ace bandage, and patches for symptoms but hasn't helped much; cologuard is due     HPI Encounter Diagnoses  Name Primary?   Strain of right forearm, initial encounter Yes   Essential hypertension    1 week history of posterior right forearm pain.  No particular injury.  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  He does workout in the gym with free weights.  Blood pressure at home runs in the 130s over 80s.  He is compliant with his losartan.  Weight has increased.    Review of Systems  Constitutional: Negative.   HENT: Negative.    Eyes:  Negative for blurred vision, discharge and redness.  Respiratory: Negative.    Cardiovascular: Negative.   Gastrointestinal:  Negative for abdominal pain.  Genitourinary: Negative.   Musculoskeletal: Negative.  Negative for myalgias.  Skin:  Negative for rash.  Neurological:  Negative for tingling, loss of consciousness and weakness.  Endo/Heme/Allergies:  Negative for polydipsia.     Current Outpatient Medications:    Cholecalciferol (VITAMIN D) 125 MCG (5000 UT) CAPS, , Disp: , Rfl:    losartan (COZAAR) 25 MG tablet, Take 1 tablet by mouth daily., Disp: , Rfl:    Multiple Vitamin (MULTIVITAMIN WITH MINERALS) TABS tablet, Take 1 tablet by mouth daily., Disp: , Rfl:    Objective:     BP (!) 140/76 (BP Location: Right Arm, Patient Position: Sitting, Cuff Size: Large) Comment: recheck after resting  Pulse (!) 45   Temp 97.8 F (36.6 C) (Temporal)   Resp 18   Wt 238 lb 3.2 oz (108 kg)   SpO2 100%   BMI 30.58 kg/m  BP Readings from Last 3 Encounters:  12/20/23 (!) 140/76  10/04/23 128/74  09/24/23 118/82   Wt  Readings from Last 3 Encounters:  12/20/23 238 lb 3.2 oz (108 kg)  10/04/23 229 lb 6.4 oz (104.1 kg)  09/24/23 234 lb (106.1 kg)      Physical Exam Constitutional:      General: He is not in acute distress.    Appearance: Normal appearance. He is not ill-appearing, toxic-appearing or diaphoretic.  HENT:     Head: Normocephalic and atraumatic.     Right Ear: External ear normal.     Left Ear: External ear normal.  Eyes:     General: No scleral icterus.       Right eye: No discharge.        Left eye: No discharge.     Extraocular Movements: Extraocular movements intact.     Conjunctiva/sclera: Conjunctivae normal.  Pulmonary:     Effort: Pulmonary effort is normal. No respiratory distress.  Musculoskeletal:     Right elbow: No swelling. Normal range of motion. No tenderness.     Right forearm: No swelling, tenderness or bony tenderness.       Arms:  Skin:    General: Skin is warm and dry.  Neurological:     Mental Status: He is alert and oriented to person, place, and time.  Psychiatric:        Mood and Affect: Mood normal.  Behavior: Behavior normal.      No results found for any visits on 12/20/23.    The 10-year ASCVD risk score (Arnett DK, et al., 2019) is: 23.6%    Assessment & Plan:   Strain of right forearm, initial encounter -     Ambulatory referral to Sports Medicine  Essential hypertension -     Basic metabolic panel    Return in about 3 months (around 03/21/2024), or Continue to check and record blood pressures.  Bring blood pressure cuff with you next visit., for Continue losartan.Marland Kitchen  Appears to have posterior forearm strain.  Sports medicine referral.  Information was given on managing hypertension.  Continue losartan.  Follow-up colonoscopy is planned for the summer.   Mliss Sax, MD

## 2023-12-25 ENCOUNTER — Ambulatory Visit (INDEPENDENT_AMBULATORY_CARE_PROVIDER_SITE_OTHER): Admitting: Sports Medicine

## 2023-12-25 ENCOUNTER — Encounter: Payer: Self-pay | Admitting: Sports Medicine

## 2023-12-25 VITALS — Ht 74.0 in | Wt 238.0 lb

## 2023-12-25 DIAGNOSIS — M79631 Pain in right forearm: Secondary | ICD-10-CM

## 2023-12-25 NOTE — Progress Notes (Signed)
   Subjective:    Patient ID: Jackson Brown, male    DOB: July 18, 1956, 68 y.o.   MRN: [id number]  HPI chief complaint: Right forearm pain  Very pleasant 68 year old male presents today with 2 weeks of right dorsal forearm pain.  He does not recall any specific injury.  He does a lot of typing and thinks that that might be the culprit.  Pain will begin at the wrist and radiate up the dorsal forearm to the elbow.  He has tried Aleve, ibuprofen, Tylenol, and various sleeves and braces.  These are all minimally helpful.  He denies numbness or tingling.  Past medical history reviewed Medications reviewed Allergies reviewed  Review of Systems As above    Objective:   Physical Exam  Developed, well-nourished.  No acute distress  Right elbow: Full range of motion.  No effusion.  No soft tissue swelling.  No tenderness to palpation at the lateral epicondyle and no reproducible pain with ECRB testing.  Right forearm: Slight tenderness to palpation in the mid dorsal forearm in the area of the extensor muscle bellies.  No soft tissue swelling.  No crepitus.  Good range of motion of the wrist distally.      Assessment & Plan:   Right forearm pain likely secondary to forearm strain  Cock up wrist brace to be worn with activity including typing for the next couple of weeks.  If symptoms do not start to improve in the next few days then we will consider adding meloxicam 15 mg once daily for 7 days.  Follow-up for ongoing or recalcitrant issues.  This note was dictated using Dragon naturally speaking software and may contain errors in syntax, spelling, or content which have not been identified prior to signing this note.

## 2023-12-28 ENCOUNTER — Encounter: Payer: Self-pay | Admitting: Sports Medicine

## 2023-12-31 ENCOUNTER — Encounter: Payer: Self-pay | Admitting: Sports Medicine

## 2024-02-15 ENCOUNTER — Ambulatory Visit (INDEPENDENT_AMBULATORY_CARE_PROVIDER_SITE_OTHER): Payer: Medicare Other

## 2024-02-15 DIAGNOSIS — Z0000 Encounter for general adult medical examination without abnormal findings: Secondary | ICD-10-CM

## 2024-02-15 NOTE — Patient Instructions (Signed)
 Mr. Jackson Brown , Thank you for taking time to come for your Medicare Wellness Visit. I appreciate your ongoing commitment to your health goals. Please review the following plan we discussed and let me know if I can assist you in the future.   Referrals/Orders/Follow-Ups/Clinician Recommendations: none  This is a list of the screening recommended for you and due dates:  Health Maintenance  Topic Date Due   Zoster (Shingles) Vaccine (1 of 2) Never done   Colon Cancer Screening  06/17/2023   COVID-19 Vaccine (4 - 2024-25 season) 06/17/2023   Flu Shot  05/16/2024   Medicare Annual Wellness Visit  02/14/2025   Pneumonia Vaccine  Completed   Hepatitis C Screening  Completed   HPV Vaccine  Aged Out   Meningitis B Vaccine  Aged Out   DTaP/Tdap/Td vaccine  Discontinued    Advanced directives: (ACP Link)Information on Advanced Care Planning can be found at [place]  Secretary of [facility] Advance Health Care Directives Advance Health Care Directives. [email or web address]   Next Medicare Annual Wellness Visit scheduled for next year: Yes  Have you seen your provider in the last 6 months (3 months if uncontrolled diabetes)? Yes has appointment in June  insert Preventive Care attachment Insert FALL PREVENTION attachment if needed

## 2024-02-15 NOTE — Progress Notes (Signed)
 Subjective:   Jackson Brown is a 68 y.o. who presents for a Medicare Wellness preventive visit.  Visit Complete: Virtual I connected with  Jackson Brown on 02/15/24 by a audio enabled telemedicine application and verified that I am speaking with the correct person using two identifiers.  Patient Location: Home  Provider Location: Office/Clinic  I discussed the limitations of evaluation and management by telemedicine. The patient expressed understanding and agreed to proceed.  Vital Signs: Because this visit was a virtual/telehealth visit, some criteria may be missing or patient reported. Any vitals not documented were not able to be obtained and vitals that have been documented are patient reported.  VideoError- Librarian, academic were attempted between this provider and patient, however failed, due to patient having technical difficulties OR patient did not have access to video capability.  We continued and completed visit with audio only.   Persons Participating in Visit: Patient.  AWV Questionnaire: Yes: Patient Medicare AWV questionnaire was completed by the patient on 02/11/2024; I have confirmed that all information answered by patient is correct and no changes since this date.  Cardiac Risk Factors include: advanced age (>17men, >61 women);male gender     Objective:    Today's Vitals   There is no height or weight on file to calculate BMI.     02/15/2024    8:10 AM 02/12/2023    8:16 AM 02/08/2022    8:21 AM 02/01/2021    8:21 AM 09/07/2015    5:21 PM  Advanced Directives  Does Patient Have a Medical Advance Directive? No No No No No  Would patient like information on creating a medical advance directive? No - Patient declined  No - Patient declined No - Patient declined     Current Medications (verified) Outpatient Encounter Medications as of 02/15/2024  Medication Sig   Cholecalciferol (VITAMIN D ) 125 MCG (5000 UT) CAPS    losartan  (COZAAR) 25 MG tablet Take 1 tablet by mouth daily.   Multiple Vitamin (MULTIVITAMIN WITH MINERALS) TABS tablet Take 1 tablet by mouth daily.   No facility-administered encounter medications on file as of 02/15/2024.    Allergies (verified) Patient has no known allergies.   History: Past Medical History:  Diagnosis Date   Skin cancer    Past Surgical History:  Procedure Laterality Date   PROSTATE SURGERY  11/21/2022   Family History  Problem Relation Age of Onset   Cancer Mother    Early death Mother    Diabetes Father    Hearing loss Father    Hypertension Father    Stroke Father    Hypertension Brother    Diabetes Paternal Grandfather    Social History   Socioeconomic History   Marital status: Married    Spouse name: Not on file   Number of children: Not on file   Years of education: Not on file   Highest education level: Bachelor's degree (e.g., BA, AB, BS)  Occupational History   Not on file  Tobacco Use   Smoking status: Never   Smokeless tobacco: Never  Vaping Use   Vaping status: Never Used  Substance and Sexual Activity   Alcohol use: Yes    Comment: occasional   Drug use: No   Sexual activity: Yes    Partners: Female  Other Topics Concern   Not on file  Social History Narrative   Not on file   Social Drivers of Health   Financial Resource Strain: Low Risk  (02/15/2024)  Overall Financial Resource Strain (CARDIA)    Difficulty of Paying Living Expenses: Not hard at all  Food Insecurity: No Food Insecurity (02/15/2024)   Hunger Vital Sign    Worried About Running Out of Food in the Last Year: Never true    Ran Out of Food in the Last Year: Never true  Transportation Needs: No Transportation Needs (02/15/2024)   PRAPARE - Administrator, Civil Service (Medical): No    Lack of Transportation (Non-Medical): No  Physical Activity: Sufficiently Active (02/15/2024)   Exercise Vital Sign    Days of Exercise per Week: 6 days    Minutes of  Exercise per Session: 60 min  Stress: No Stress Concern Present (02/15/2024)   [employer] of Occupational Health - Occupational Stress Questionnaire    Feeling of Stress : Not at all  Social Connections: Moderately Isolated (02/15/2024)   Social Connection and Isolation Panel [NHANES]    Frequency of Communication with Friends and Family: More than three times a week    Frequency of Social Gatherings with Friends and Family: Patient declined    Attends Religious Services: Never    Database administrator or Organizations: No    Attends Engineer, structural: Never    Marital Status: Married    Tobacco Counseling Counseling given: Not Answered    Clinical Intake:  Pre-visit preparation completed: Yes  Pain : No/denies pain     Nutritional Risks: None Diabetes: No  No results found for: "HGBA1C"   How often do you need to have someone help you when you read instructions, pamphlets, or other written materials from your doctor or pharmacy?: 1 - Never  Interpreter Needed?: No  Information entered by :: NAllen LPN   Activities of Daily Living     02/11/2024    7:10 PM  In your present state of health, do you have any difficulty performing the following activities:  Hearing? 0  Vision? 0  Difficulty concentrating or making decisions? 0  Walking or climbing stairs? 0  Dressing or bathing? 0  Doing errands, shopping? 0  Preparing Food and eating ? N  Using the Toilet? N  In the past six months, have you accidently leaked urine? Y  Do you have problems with loss of bowel control? N  Managing your Medications? N  Managing your Finances? N  Housekeeping or managing your Housekeeping? N    Patient Care Team: Tonna Frederic, MD as PCP - General (Family Medicine)  Indicate any recent Medical Services you may have received from other than [facility] providers in the past year (date may be approximate).     Assessment:   This is a routine wellness  examination for Jackson Brown.  Hearing/Vision screen Hearing Screening - Comments:: Denies hearing issues Vision Screening - Comments:: Regular eye exams, [facility]   Goals Addressed             This Visit's Progress    Patient Stated       02/15/2024, weight management       Depression Screen     02/15/2024    8:11 AM 12/20/2023    1:43 PM 03/26/2023    8:46 AM 02/12/2023    8:17 AM 10/24/2022   10:13 AM 04/11/2022    9:19 AM 02/08/2022    8:22 AM  PHQ 2/9 Scores  PHQ - 2 Score 0 0 0 0 0 0 0  PHQ- 9 Score 0  Fall Risk     02/11/2024    7:10 PM 12/20/2023    1:43 PM 03/26/2023    8:46 AM 02/08/2023    4:00 PM 10/24/2022   10:13 AM  Fall Risk   Falls in the past year? 0 0 0 0 0  Number falls in past yr: 0 0 0 0 0  Injury with Fall? 0 0 0 0 0  Risk for fall due to : Medication side effect No Fall Risks No Fall Risks No Fall Risks No Fall Risks  Follow up Falls prevention discussed;Falls evaluation completed Falls evaluation completed Falls evaluation completed Falls prevention discussed;Education provided;Falls evaluation completed Falls evaluation completed    MEDICARE RISK AT HOME:  Medicare Risk at Home Any stairs in or around the home?: (Patient-Rptd) Yes If so, are there any without handrails?: (Patient-Rptd) No Home free of loose throw rugs in walkways, pet beds, electrical cords, etc?: (Patient-Rptd) Yes Adequate lighting in your home to reduce risk of falls?: (Patient-Rptd) Yes Life alert?: (Patient-Rptd) No Use of a cane, walker or w/c?: (Patient-Rptd) No Grab bars in the bathroom?: (Patient-Rptd) No Shower chair or bench in shower?: (Patient-Rptd) Yes Elevated toilet seat or a handicapped toilet?: (Patient-Rptd) Yes  TIMED UP AND GO:  Was the test performed?  No  Cognitive Function: 6CIT completed        02/15/2024    8:11 AM 02/12/2023    8:18 AM  6CIT Screen  What Year? 0 points 0 points  What month? 0 points 0 points  What time? 0 points 0 points   Count back from 20 0 points 0 points  Months in reverse 0 points 0 points  Repeat phrase 2 points 2 points  Total Score 2 points 2 points    Immunizations Immunization History  Administered Date(s) Administered   Influenza Inj Mdck Quad Pf 08/01/2018   Influenza, High Dose Seasonal PF 07/22/2021, 10/24/2022   Influenza,inj,Quad PF,6+ Mos 09/01/2019   Influenza-Unspecified 08/15/2017, 07/21/2020   Moderna Sars-Covid-2 Vaccination 07/25/2020, 08/20/2020, 02/22/2021   PNEUMOCOCCAL CONJUGATE-20 10/24/2022    Screening Tests Health Maintenance  Topic Date Due   Zoster Vaccines- Shingrix (1 of 2) Never done   Colonoscopy  06/17/2023   COVID-19 Vaccine (4 - 2024-25 season) 06/17/2023   INFLUENZA VACCINE  05/16/2024   Medicare Annual Wellness (AWV)  02/14/2025   Pneumonia Vaccine 78+ Years old  Completed   Hepatitis C Screening  Completed   HPV VACCINES  Aged Out   Meningococcal B Vaccine  Aged Out   DTaP/Tdap/Td  Discontinued    Health Maintenance  Health Maintenance Due  Topic Date Due   Zoster Vaccines- Shingrix (1 of 2) Never done   Colonoscopy  06/17/2023   COVID-19 Vaccine (4 - 2024-25 season) 06/17/2023   Health Maintenance Items Addressed: States colonoscopy is scheduled for this year. Declines vaccines  Additional Screening:  Vision Screening: Recommended annual ophthalmology exams for early detection of glaucoma and other disorders of the eye.  Dental Screening: Recommended annual dental exams for proper oral hygiene  Community Resource Referral / Chronic Care Management: CRR required this visit?  No   CCM required this visit?  No     Plan:     I have personally reviewed and noted the following in the patient's chart:   Medical and social history Use of alcohol, tobacco or illicit drugs  Current medications and supplements including opioid prescriptions. Patient is not currently taking opioid prescriptions. Functional ability and status Nutritional  status Physical  activity Advanced directives List of other physicians Hospitalizations, surgeries, and ER visits in previous 12 months Vitals Screenings to include cognitive, depression, and falls Referrals and appointments  In addition, I have reviewed and discussed with patient certain preventive protocols, quality metrics, and best practice recommendations. A written personalized care plan for preventive services as well as general preventive health recommendations were provided to patient.     Jackson Beecham, LPN   4/0/9811   After Visit Summary: (MyChart) Due to this being a telephonic visit, the after visit summary with patients personalized plan was offered to patient via MyChart   Notes: Nothing significant to report at this time.

## 2024-03-07 ENCOUNTER — Ambulatory Visit (INDEPENDENT_AMBULATORY_CARE_PROVIDER_SITE_OTHER): Admitting: Family Medicine

## 2024-03-07 ENCOUNTER — Ambulatory Visit: Payer: Self-pay | Admitting: Family Medicine

## 2024-03-07 ENCOUNTER — Encounter: Payer: Self-pay | Admitting: Family Medicine

## 2024-03-07 VITALS — BP 122/72 | HR 51 | Temp 97.8°F | Ht 74.0 in | Wt 231.2 lb

## 2024-03-07 DIAGNOSIS — I10 Essential (primary) hypertension: Secondary | ICD-10-CM

## 2024-03-07 DIAGNOSIS — Z13220 Encounter for screening for lipoid disorders: Secondary | ICD-10-CM | POA: Diagnosis not present

## 2024-03-07 DIAGNOSIS — Z9189 Other specified personal risk factors, not elsewhere classified: Secondary | ICD-10-CM | POA: Diagnosis not present

## 2024-03-07 LAB — LIPID PANEL
Cholesterol: 169 mg/dL (ref 0–200)
HDL: 36.9 mg/dL — ABNORMAL LOW (ref >39.00)
LDL Cholesterol: 95 mg/dL (ref 0–99)
NonHDL: 131.66
Total CHOL/HDL Ratio: 5
Triglycerides: 181 mg/dL — ABNORMAL HIGH (ref 0.0–149.0)
VLDL: 36.2 mg/dL (ref 0.0–40.0)

## 2024-03-07 LAB — URINALYSIS, ROUTINE W REFLEX MICROSCOPIC
Bilirubin Urine: NEGATIVE
Hgb urine dipstick: NEGATIVE
Ketones, ur: NEGATIVE
Leukocytes,Ua: NEGATIVE
Nitrite: NEGATIVE
RBC / HPF: NONE SEEN (ref 0–?)
Specific Gravity, Urine: 1.015 (ref 1.000–1.030)
Total Protein, Urine: NEGATIVE
Urine Glucose: NEGATIVE
Urobilinogen, UA: 0.2 (ref 0.0–1.0)
WBC, UA: NONE SEEN (ref 0–?)
pH: 6.5 (ref 5.0–8.0)

## 2024-03-07 NOTE — Progress Notes (Addendum)
 Established Patient Office Visit   Subjective:  Patient ID: Jackson Brown, male    DOB: 1955/10/19  Age: 68 y.o. MRN: [id number]  Chief Complaint  Patient presents with   Medical Management of Chronic Issues    3 month follow up. Pt is not fasting. No concerns.     HPI Encounter Diagnoses  Name Primary?   At risk for coronary artery disease Yes   Essential hypertension    Screening for cholesterol level    Follow-up of above.  Patient has been able to lose some weight.  He is currently holding his losartan blood pressures at home have been running in the 120s over low 80s.  10-year ASCVD risk score is 19%.   Review of Systems  Constitutional: Negative.   HENT: Negative.    Eyes:  Negative for blurred vision, discharge and redness.  Respiratory: Negative.    Cardiovascular: Negative.   Gastrointestinal:  Negative for abdominal pain.  Genitourinary: Negative.   Musculoskeletal: Negative.  Negative for myalgias.  Skin:  Negative for rash.  Neurological:  Negative for tingling, loss of consciousness and weakness.  Endo/Heme/Allergies:  Negative for polydipsia.     Current Outpatient Medications:    atorvastatin (LIPITOR) 20 MG tablet, Take 1 tablet (20 mg total) by mouth daily., Disp: 90 tablet, Rfl: 3   Cholecalciferol (VITAMIN D ) 125 MCG (5000 UT) CAPS, , Disp: , Rfl:    losartan (COZAAR) 25 MG tablet, Take 1 tablet by mouth daily., Disp: , Rfl:    Multiple Vitamin (MULTIVITAMIN WITH MINERALS) TABS tablet, Take 1 tablet by mouth daily., Disp: , Rfl:    Objective:     BP 122/72 (Cuff Size: Normal)   Pulse (!) 51   Temp 97.8 F (36.6 C) (Temporal)   Ht 6' 2 (1.88 m)   Wt 231 lb 3.2 oz (104.9 kg)   SpO2 94%   BMI 29.68 kg/m  BP Readings from Last 3 Encounters:  03/07/24 122/72  12/20/23 (!) 140/76  10/04/23 128/74   Wt Readings from Last 3 Encounters:  03/07/24 231 lb 3.2 oz (104.9 kg)  12/25/23 238 lb (108 kg)  12/20/23 238 lb 3.2 oz (108 kg)       Physical Exam Constitutional:      General: He is not in acute distress.    Appearance: Normal appearance. He is not ill-appearing, toxic-appearing or diaphoretic.  HENT:     Head: Normocephalic and atraumatic.     Right Ear: External ear normal.     Left Ear: External ear normal.   Eyes:     General: No scleral icterus.       Right eye: No discharge.        Left eye: No discharge.     Extraocular Movements: Extraocular movements intact.     Conjunctiva/sclera: Conjunctivae normal.   Pulmonary:     Effort: Pulmonary effort is normal. No respiratory distress.   Skin:    General: Skin is warm and dry.   Neurological:     Mental Status: He is alert and oriented to person, place, and time.   Psychiatric:        Mood and Affect: Mood normal.        Behavior: Behavior normal.      Results for orders placed or performed in visit on 03/07/24  Lipid panel  Result Value Ref Range   Cholesterol 169 0 - 200 mg/dL   Triglycerides 818.9 (H) 0.0 - 149.0 mg/dL   HDL 63.09 (  L) >39.00 mg/dL   VLDL 63.7 0.0 - 59.9 mg/dL   LDL Cholesterol 95 0 - 99 mg/dL   Total CHOL/HDL Ratio 5    NonHDL 131.66   Urinalysis, Routine w reflex microscopic  Result Value Ref Range   Color, Urine YELLOW Yellow;Lt. Yellow;Straw;Dark Yellow;Amber;Green;Red;Brown   APPearance CLEAR Clear;Turbid;Slightly Cloudy;Cloudy   Specific Gravity, Urine 1.015 1.000 - 1.030   pH 6.5 5.0 - 8.0   Total Protein, Urine NEGATIVE Negative   Urine Glucose NEGATIVE Negative   Ketones, ur NEGATIVE Negative   Bilirubin Urine NEGATIVE Negative   Hgb urine dipstick NEGATIVE Negative   Urobilinogen, UA 0.2 0.0 - 1.0   Leukocytes,Ua NEGATIVE Negative   Nitrite NEGATIVE Negative   WBC, UA none seen 0-2/hpf   RBC / HPF none seen 0-2/hpf      The 10-year ASCVD risk score (Arnett DK, et al., 2019) is: 19%    Assessment & Plan:   At risk for coronary artery disease -     CT CARDIAC SCORING (SELF PAY ONLY); Future -      Atorvastatin Calcium; Take 1 tablet (20 mg total) by mouth daily.  Dispense: 90 tablet; Refill: 3 -     Ambulatory referral to Cardiology  Essential hypertension -     CBC; Future -     Urinalysis, Routine w reflex microscopic; Future  Screening for cholesterol level -     Lipid panel; Future    Return in about 3 months (around 06/07/2024), or Please bring your blood pressure cuff with you next visit..  Will continue weight loss efforts and hold of losartan.  Check and record blood pressures.  Coronary artery calcium scoring with his ASCVD risk score of 19%.  Elsie Sim Lent, MD

## 2024-03-24 ENCOUNTER — Ambulatory Visit: Admitting: Family Medicine

## 2024-04-11 ENCOUNTER — Ambulatory Visit (HOSPITAL_BASED_OUTPATIENT_CLINIC_OR_DEPARTMENT_OTHER)
Admission: RE | Admit: 2024-04-11 | Discharge: 2024-04-11 | Disposition: A | Discharge status: Home / Self Care | Payer: Self-pay | Source: Ambulatory Visit | Attending: Family Medicine | Admitting: Family Medicine

## 2024-04-11 DIAGNOSIS — Z9189 Other specified personal risk factors, not elsewhere classified: Secondary | ICD-10-CM | POA: Insufficient documentation

## 2024-04-14 ENCOUNTER — Ambulatory Visit (INDEPENDENT_AMBULATORY_CARE_PROVIDER_SITE_OTHER): Payer: Medicare Other | Admitting: Physician Assistant

## 2024-04-14 MED ORDER — ATORVASTATIN CALCIUM 20 MG PO TABS: 20.0000 mg | ORAL_TABLET | Freq: Every day | ORAL | 3 refills | Status: AC

## 2024-04-14 NOTE — Addendum Note (Signed)
 Addended by: BERNETA ELSIE LABOR on: 04/14/2024 08:03 AM   Modules accepted: Orders

## 2024-04-25 ENCOUNTER — Ambulatory Visit (INDEPENDENT_AMBULATORY_CARE_PROVIDER_SITE_OTHER): Admitting: Physician Assistant

## 2024-04-25 ENCOUNTER — Encounter (INDEPENDENT_AMBULATORY_CARE_PROVIDER_SITE_OTHER): Payer: Self-pay | Admitting: Physician Assistant

## 2024-04-25 VITALS — BP 114/67 | HR 66

## 2024-04-25 DIAGNOSIS — H6123 Impacted cerumen, bilateral: Secondary | ICD-10-CM | POA: Diagnosis not present

## 2024-04-25 NOTE — Progress Notes (Signed)
 Dear Dr. Berneta, Here is my assessment for our mutual patient, Jackson Brown. Thank you for allowing me the opportunity to care for your patient. Please do not hesitate to contact me should you have any other questions. Sincerely, Chyrl Cohen PA-C  Otolaryngology Clinic Note Referring provider: Dr. Berneta HPI:  Jackson Brown is a 67 y.o. male kindly referred by Dr. Berneta   The patient is a 68 year old gentleman seen in our office for follow-up evaluation of cerumen impaction.  The patient was last seen in the office on 10/15/2023.  Below is a recap of the encounter.  The patient presents today with cerumen impaction.  The patient notes that he has a history of hearing loss.  He notes a longstanding history of bilateral symmetric hearing loss, he started using hearing aids approximately 8 years ago.  He notes his last audiogram was approximately 3 to 4 years ago at [facility] on [address]  He notes things have been going well, he is happy with his hearing aids as they help him significantly.  He was supposed to have an audiogram that too much cerumen, primary care provider who attempted irrigation of the cerumen, this was unsuccessful.  He was started on otic drops and referred to.  Just to his hearing, he denies any signs of infection, no other concerns.   Update 04/25/2024  Since his last office visit the patient notes he has been doing well, and he denies any changes to his hearing, no problems with his ears.    Independent Review of Additional Tests or Records:  None   PMH/Meds/All/SocHx/FamHx/ROS:   Past Medical History:  Diagnosis Date   Skin cancer      Past Surgical History:  Procedure Laterality Date   PROSTATE SURGERY  11/21/2022    Family History  Problem Relation Age of Onset   Cancer Mother    Early death Mother    Diabetes Father    Hearing loss Father    Hypertension Father    Stroke Father    Hypertension Brother    Diabetes Paternal Grandfather       Social Connections: Moderately Isolated (02/15/2024)   Social Connection and Isolation Panel    Frequency of Communication with Friends and Family: More than three times a week    Frequency of Social Gatherings with Friends and Family: Patient declined    Attends Religious Services: Never    Database administrator or Organizations: No    Attends Engineer, structural: Never    Marital Status: Married      Current Outpatient Medications:    atorvastatin  (LIPITOR) 20 MG tablet, Take 1 tablet (20 mg total) by mouth daily., Disp: 90 tablet, Rfl: 3   Cholecalciferol (VITAMIN D ) 125 MCG (5000 UT) CAPS, , Disp: , Rfl:    losartan (COZAAR) 25 MG tablet, Take 1 tablet by mouth daily., Disp: , Rfl:    Multiple Vitamin (MULTIVITAMIN WITH MINERALS) TABS tablet, Take 1 tablet by mouth daily., Disp: , Rfl:    Physical Exam:   There were no vitals taken for this visit.  Pertinent Findings  CN II-XII intact Cerumen impaction at the distal EAC bilateral No obviously  neck masses/lymphadenopathy/thyromegaly No respiratory distress or stridor  Seprately Identifiable Procedures:  Procedure: Bilateral ear microscopy and cerumen removal using microscope (CPT [phone]) - Mod 50 Pre-procedure diagnosis: bilateral cerumen impaction external auditory canals Post-procedure diagnosis: same Indication: bilateral cerumen impaction; given patient's otologic complaints and history as well as for improved  and comprehensive examination of external ear and tympanic membrane, bilateral otologic examination using microscope was performed and impacted cerumen removed  Procedure: Patient was placed semi-recumbent. Both ear canals were examined using the microscope with findings above. Cerumen removed from bilateral external auditory canals using suction and currette with improvement in EAC examination and patency. Left: EAC was patent. TM was intact . Middle ear was aerated. Drainage: none Right: EAC was patent.  TM was intact . Middle ear was aerated . Drainage: none Patient tolerated the procedure well.  Impression & Plans:  Jackson Brown is a 68 y.o. male with the following   Cerumen impaction-  The patient presented today with cerumen impaction.  This was removed without difficulty.  I see no signs of infection.  The patient will reach out to the office if she develops any new or worsening signs or symptoms.      - f/u PRN   Thank you for allowing me the opportunity to care for your patient. Please do not hesitate to contact me should you have any other questions.  Sincerely, Chyrl Cohen PA-C [facility] Phone: [phone] Fax: [phone]  04/25/2024, 1:24 PM

## 2024-06-23 ENCOUNTER — Ambulatory Visit: Admitting: Family Medicine

## 2024-06-23 VITALS — BP 123/71 | HR 87 | Temp 97.2°F | Resp 18 | Wt 228.6 lb

## 2024-06-23 DIAGNOSIS — Z131 Encounter for screening for diabetes mellitus: Secondary | ICD-10-CM | POA: Diagnosis not present

## 2024-06-23 DIAGNOSIS — I10 Essential (primary) hypertension: Secondary | ICD-10-CM | POA: Diagnosis not present

## 2024-06-23 DIAGNOSIS — Z9189 Other specified personal risk factors, not elsewhere classified: Secondary | ICD-10-CM | POA: Diagnosis not present

## 2024-06-23 DIAGNOSIS — Z1211 Encounter for screening for malignant neoplasm of colon: Secondary | ICD-10-CM

## 2024-06-23 DIAGNOSIS — Z23 Encounter for immunization: Secondary | ICD-10-CM

## 2024-06-23 LAB — BASIC METABOLIC PANEL WITH GFR
BUN: 17 mg/dL (ref 6–23)
CO2: 29 meq/L (ref 19–32)
Calcium: 9.5 mg/dL (ref 8.4–10.5)
Chloride: 103 meq/L (ref 96–112)
Creatinine, Ser: 1.17 mg/dL (ref 0.40–1.50)
GFR: 63.58 mL/min (ref >60.00)
Glucose, Bld: 100 mg/dL — ABNORMAL HIGH (ref 70–99)
Potassium: 4.7 meq/L (ref 3.5–5.1)
Sodium: 139 meq/L (ref 135–145)

## 2024-06-23 LAB — HEMOGLOBIN A1C: Hgb A1c MFr Bld: 6.3 % (ref 4.6–6.5)

## 2024-06-23 LAB — LDL CHOLESTEROL, DIRECT: Direct LDL: 92 mg/dL

## 2024-06-23 NOTE — Progress Notes (Signed)
 Established Patient Office Visit   Subjective:  Patient ID: Jackson Brown, male    DOB: 02/15/1956  Age: 68 y.o. MRN: [id number]  Chief Complaint  Patient presents with   Hypertension    3 month follow up. Pt is not fasting today  HM due- shingles and flu vaccine     HPI Encounter Diagnoses  Name Primary?   At risk for coronary artery disease Yes   Immunization due    Screening for diabetes mellitus    Screening for colon cancer    Essential hypertension    Essential (primary) hypertension    Follow-up of above.  Continues losartan for well-controlled blood pressure.  He decided not to start the atorvastatin .  Coronary artery calcium  scoring came back at 723.  Follow-up with [facility] in December.  Due for colon cancer screening.  Last PSA was 0.09.  Continues follow-up with [facility].   Review of Systems  Constitutional: Negative.   HENT: Negative.    Eyes:  Negative for blurred vision, discharge and redness.  Respiratory: Negative.    Cardiovascular: Negative.   Gastrointestinal:  Negative for abdominal pain.  Genitourinary: Negative.   Musculoskeletal: Negative.  Negative for myalgias.  Skin:  Negative for rash.  Neurological:  Negative for tingling, loss of consciousness and weakness.  Endo/Heme/Allergies:  Negative for polydipsia.     Current Outpatient Medications:    atorvastatin  (LIPITOR) 20 MG tablet, Take 1 tablet (20 mg total) by mouth daily., Disp: 90 tablet, Rfl: 3   Cholecalciferol (VITAMIN D ) 125 MCG (5000 UT) CAPS, , Disp: , Rfl:    losartan (COZAAR) 25 MG tablet, Take 1 tablet by mouth daily., Disp: , Rfl:    Multiple Vitamin (MULTIVITAMIN WITH MINERALS) TABS tablet, Take 1 tablet by mouth daily., Disp: , Rfl:    Objective:     BP 123/71 (BP Location: Right Arm, Patient Position: Sitting, Cuff Size: Large) Comment: clinic machine reading  Pulse 87   Temp (!) 97.2 F (36.2 C) (Temporal)   Resp 18   Wt 228 lb 9.6 oz (103.7 kg)   SpO2  97%   BMI 29.35 kg/m  BP Readings from Last 3 Encounters:  06/23/24 123/71  04/25/24 114/67  03/07/24 122/72   Wt Readings from Last 3 Encounters:  06/23/24 228 lb 9.6 oz (103.7 kg)  03/07/24 231 lb 3.2 oz (104.9 kg)  12/25/23 238 lb (108 kg)      Physical Exam Constitutional:      General: He is not in acute distress.    Appearance: Normal appearance. He is not ill-appearing, toxic-appearing or diaphoretic.  HENT:     Head: Normocephalic and atraumatic.     Right Ear: External ear normal.     Left Ear: External ear normal.  Eyes:     General: No scleral icterus.       Right eye: No discharge.        Left eye: No discharge.     Extraocular Movements: Extraocular movements intact.     Conjunctiva/sclera: Conjunctivae normal.  Pulmonary:     Effort: Pulmonary effort is normal. No respiratory distress.  Skin:    General: Skin is warm and dry.  Neurological:     Mental Status: He is alert and oriented to person, place, and time.  Psychiatric:        Mood and Affect: Mood normal.        Behavior: Behavior normal.      No results found for any visits  on 06/23/24.    The 10-year ASCVD risk score (Arnett DK, et al., 2019) is: 19.2%    Assessment & Plan:   At risk for coronary artery disease -     LDL cholesterol, direct  Immunization due -     Varicella-zoster vaccine IM -     Flu vaccine HIGH DOSE PF(Fluzone Trivalent)  Screening for diabetes mellitus -     Basic metabolic panel with GFR -     Hemoglobin A1c  Screening for colon cancer -     Ambulatory referral to Gastroenterology  Essential hypertension -     Basic metabolic panel with GFR  Essential (primary) hypertension -     LDL cholesterol, direct    Return in about 3 months (around 09/22/2024).  With his elevated coronary artery calcium  scoring, advised that he go ahead and start the atorvastatin  with elevated ASCVD risk score and elevated coronary artery calcium  score.  Follow-up consultation  with [facility] in December.  Second shingles vaccine in 3 months.  Elsie Sim Lent, MD

## 2024-06-24 ENCOUNTER — Ambulatory Visit: Payer: Self-pay | Admitting: Family Medicine

## 2024-07-03 ENCOUNTER — Encounter (HOSPITAL_BASED_OUTPATIENT_CLINIC_OR_DEPARTMENT_OTHER): Payer: Self-pay

## 2024-09-22 ENCOUNTER — Ambulatory Visit: Admitting: Family Medicine

## 2024-09-22 ENCOUNTER — Encounter: Payer: Self-pay | Admitting: Family Medicine

## 2024-09-22 VITALS — BP 122/78 | HR 64 | Temp 97.6°F | Ht 74.0 in | Wt 235.8 lb

## 2024-09-22 DIAGNOSIS — G5793 Unspecified mononeuropathy of bilateral lower limbs: Secondary | ICD-10-CM | POA: Diagnosis not present

## 2024-09-22 DIAGNOSIS — Z131 Encounter for screening for diabetes mellitus: Secondary | ICD-10-CM

## 2024-09-22 DIAGNOSIS — Z23 Encounter for immunization: Secondary | ICD-10-CM | POA: Diagnosis not present

## 2024-09-22 DIAGNOSIS — E7800 Pure hypercholesterolemia, unspecified: Secondary | ICD-10-CM

## 2024-09-22 DIAGNOSIS — Z9189 Other specified personal risk factors, not elsewhere classified: Secondary | ICD-10-CM

## 2024-09-22 DIAGNOSIS — M7662 Achilles tendinitis, left leg: Secondary | ICD-10-CM

## 2024-09-22 LAB — BASIC METABOLIC PANEL WITH GFR
BUN: 19 mg/dL (ref 6–23)
CO2: 25 meq/L (ref 19–32)
Calcium: 9.4 mg/dL (ref 8.4–10.5)
Chloride: 103 meq/L (ref 96–112)
Creatinine, Ser: 0.98 mg/dL (ref 0.40–1.50)
GFR: 78.51 mL/min (ref >60.00)
Glucose, Bld: 89 mg/dL (ref 70–99)
Potassium: 4.2 meq/L (ref 3.5–5.1)
Sodium: 139 meq/L (ref 135–145)

## 2024-09-22 LAB — TSH: TSH: 1.55 u[IU]/mL (ref 0.35–5.50)

## 2024-09-22 LAB — VITAMIN B12: Vitamin B-12: 460 pg/mL (ref 211–911)

## 2024-09-22 LAB — HEMOGLOBIN A1C: Hgb A1c MFr Bld: 5.9 % (ref 4.6–6.5)

## 2024-09-22 LAB — LDL CHOLESTEROL, DIRECT: Direct LDL: 56 mg/dL

## 2024-09-22 NOTE — Progress Notes (Signed)
 Established Patient Office Visit   Subjective:  Patient ID: Jackson Brown, male    DOB: 07/09/1956  Age: 68 y.o. MRN: [id number]  Chief Complaint  Patient presents with   Medical Management of Chronic Issues    3 mon f/u. Patient wants to discuss neuropathy in his feet as well as achilles discomfort in his left foot. Also wants Vitamin D  levels checked. Pt is not fasting. Pt has not had Colonoscopy done as of yet     HPI Encounter Diagnoses  Name Primary?   Achilles tendinitis of left lower extremity Yes   Neuropathy of both feet    Screening for diabetes mellitus    Immunization due    At risk for coronary artery disease    Elevated LDL cholesterol level    Follow-up of above.  He has been taking atorvastatin  20 mg daily for elevated LDL and elevated coronary artery calcium  score.  No issues there.  He has been experiencing neuropathy between the 1st and 2nd toes of both feet right being affected greater than left.  Denies back pain at this time.  Had some pain in the back of his heel on the left side.  He continues to walk regularly for exercise.  Continues to follow-up with [facility] cardiology and urology.  He is concerned about a slow elevation of his PSA after prostatectomy.  May need consultation with oncology.  Continues to work part-time   Review of Systems  Constitutional: Negative.   HENT: Negative.    Eyes:  Negative for blurred vision, discharge and redness.  Respiratory: Negative.    Cardiovascular: Negative.   Gastrointestinal:  Negative for abdominal pain.  Genitourinary: Negative.   Musculoskeletal: Negative.  Negative for back pain and myalgias.  Skin:  Negative for rash.  Neurological:  Positive for tingling. Negative for loss of consciousness and weakness.  Endo/Heme/Allergies:  Negative for polydipsia.   Diabetic Foot Exam - Simple   Simple Foot Form Diabetic Foot exam was performed with the following findings: Yes 09/22/2024  9:34 AM  Visual Inspection See  comments: Yes Sensation Testing See comments: Yes Pulse Check Posterior Tibialis and Dorsalis pulse intact bilaterally: Yes Comments Feet are cavus bilaterally.  Hallux valgus on the right.  Pulses are intact.       Current Outpatient Medications:    atorvastatin  (LIPITOR) 20 MG tablet, Take 1 tablet (20 mg total) by mouth daily., Disp: 90 tablet, Rfl: 3   Cholecalciferol (VITAMIN D ) 125 MCG (5000 UT) CAPS, , Disp: , Rfl:    losartan (COZAAR) 25 MG tablet, Take 1 tablet by mouth daily., Disp: , Rfl:    Multiple Vitamin (MULTIVITAMIN WITH MINERALS) TABS tablet, Take 1 tablet by mouth daily., Disp: , Rfl:    Objective:     BP 122/78   Pulse 64   Temp 97.6 F (36.4 C)   Ht 6' 2 (1.88 m)   Wt 235 lb 12.8 oz (107 kg)   SpO2 94%   BMI 30.27 kg/m    Physical Exam Constitutional:      General: He is not in acute distress.    Appearance: Normal appearance. He is not ill-appearing, toxic-appearing or diaphoretic.  HENT:     Head: Normocephalic and atraumatic.     Right Ear: External ear normal.     Left Ear: External ear normal.  Eyes:     General: No scleral icterus.       Right eye: No discharge.  Left eye: No discharge.     Extraocular Movements: Extraocular movements intact.     Conjunctiva/sclera: Conjunctivae normal.  Pulmonary:     Effort: Pulmonary effort is normal. No respiratory distress.  Musculoskeletal:     Right ankle:     Right Achilles Tendon: No tenderness or defects. Thompson's test negative.     Left ankle:     Left Achilles Tendon: No tenderness or defects. Thompson's test negative.  Skin:    General: Skin is warm and dry.  Neurological:     Mental Status: He is alert and oriented to person, place, and time.  Psychiatric:        Mood and Affect: Mood normal.        Behavior: Behavior normal.      No results found for any visits on 09/22/24.    The 10-year ASCVD risk score (Arnett DK, et al., 2019) is: 19%    Assessment & Plan:    Achilles tendinitis of left lower extremity -     Ambulatory referral to Orthopedic Surgery  Neuropathy of both feet -     Vitamin B12 -     TSH -     Hemoglobin A1c -     Ambulatory referral to Orthopedic Surgery  Screening for diabetes mellitus -     Hemoglobin A1c -     Basic metabolic panel with GFR  Immunization due -     Varicella-zoster vaccine IM  At risk for coronary artery disease -     LDL cholesterol, direct  Elevated LDL cholesterol level -     LDL cholesterol, direct    Return in about 6 months (around 03/23/2025).    Elsie Sim Lent, MD

## 2024-09-23 ENCOUNTER — Ambulatory Visit: Payer: Self-pay | Admitting: Family Medicine

## 2024-09-25 NOTE — Addendum Note (Signed)
 Addended by: BERNETA ELSIE LABOR on: 09/25/2024 08:03 AM   Modules accepted: Orders

## 2024-09-26 ENCOUNTER — Other Ambulatory Visit

## 2024-09-26 DIAGNOSIS — E559 Vitamin D deficiency, unspecified: Secondary | ICD-10-CM

## 2024-09-27 LAB — VITAMIN D 25 HYDROXY (VIT D DEFICIENCY, FRACTURES): Vit D, 25-Hydroxy: 63 ng/mL (ref 30–100)

## 2024-09-29 ENCOUNTER — Ambulatory Visit: Payer: Self-pay | Admitting: Family Medicine

## 2024-10-27 ENCOUNTER — Encounter (INDEPENDENT_AMBULATORY_CARE_PROVIDER_SITE_OTHER): Payer: Self-pay | Admitting: Physician Assistant

## 2024-10-27 ENCOUNTER — Ambulatory Visit (INDEPENDENT_AMBULATORY_CARE_PROVIDER_SITE_OTHER): Admitting: Physician Assistant

## 2024-10-27 VITALS — BP 105/67 | HR 68

## 2024-10-27 DIAGNOSIS — H6123 Impacted cerumen, bilateral: Secondary | ICD-10-CM | POA: Diagnosis not present

## 2024-10-27 DIAGNOSIS — Z09 Encounter for follow-up examination after completed treatment for conditions other than malignant neoplasm: Secondary | ICD-10-CM | POA: Diagnosis not present

## 2024-10-28 NOTE — Progress Notes (Signed)
 Dear Dr. Berneta, Here is my assessment for our mutual patient, Jackson Brown. Thank you for allowing me the opportunity to care for your patient. Please do not hesitate to contact me should you have any other questions. Sincerely, Chyrl Cohen PA-C  Otolaryngology Clinic Note Referring provider: Dr. Berneta HPI:  Jackson Brown is a 69 y.o. male kindly referred by Dr. Berneta    Jackson Brown seen in our office for follow-up evaluation of cerumen impaction.  His last seen in the office on 04/25/2024.  The patient does wear hearing aids, he notes since last office visit he denies any significant complaints or concerns.  He denies any changes to his hearing, no fullness, no infectious signs or symptoms.  Independent Review of Additional Tests or Records:  None   PMH/Meds/All/SocHx/FamHx/ROS:   Past Medical History:  Diagnosis Date   Skin cancer      Past Surgical History:  Procedure Laterality Date   APPENDECTOMY     PROSTATE SURGERY  11/21/2022    Family History  Problem Relation Age of Onset   Cancer Mother    Early death Mother    Diabetes Father    Hearing loss Father    Hypertension Father    Stroke Father    Hypertension Brother    Diabetes Paternal Grandfather      Social Connections: Moderately Isolated (09/17/2024)   Social Connection and Isolation Panel    Frequency of Communication with Friends and Family: More than three times a week    Frequency of Social Gatherings with Friends and Family: Patient declined    Attends Religious Services: Patient declined    Database Administrator or Organizations: No    Attends Engineer, Structural: Not on file    Marital Status: Married     Current Medications[1]   Physical Exam:   BP 105/67   Pulse 68   SpO2 95%   Pertinent Findings  CN II-XII grossly intact Bilateral EACs with minimal cerumen, no impaction, TMs intact with well-pneumatized middle ear space Anterior rhinoscopy: Septum  midline No lesions of oral cavity/oropharynx; dentition within normal limits No obviously palpable neck masses/lymphadenopathy/thyromegaly No respiratory distress or stridor    Seprately Identifiable Procedures:  None  Impression & Plans:  Jackson Brown is a 69 y.o. male with the following    Cerumen impaction-  Follow-up evaluation for cerumen impaction.  Been approximately 6 to 7 months, no reaccumulation of impaction.  Would like to see the patient back in the office in 1 year or sooner as needed.  He verbalized understanding and agreement to today's plan.  - f/u 1 year   Thank you for allowing me the opportunity to care for your patient. Please do not hesitate to contact me should you have any other questions.  Sincerely, Chyrl Cohen PA-C [facility] Phone: [phone] Fax: [phone]  10/28/2024, 8:46 AM            [1]  Current Outpatient Medications:    Cholecalciferol (VITAMIN D ) 125 MCG (5000 UT) CAPS, , Disp: , Rfl:    losartan (COZAAR) 25 MG tablet, Take 1 tablet by mouth daily., Disp: , Rfl:    Multiple Vitamin (MULTIVITAMIN WITH MINERALS) TABS tablet, Take 1 tablet by mouth daily., Disp: , Rfl:    atorvastatin  (LIPITOR) 20 MG tablet, Take 1 tablet (20 mg total) by mouth daily. (Patient not taking: Reported on 10/27/2024), Disp: 90 tablet, Rfl: 3

## 2025-02-16 ENCOUNTER — Ambulatory Visit

## 2025-03-23 ENCOUNTER — Ambulatory Visit: Admitting: Family Medicine
